# Patient Record
Sex: Female | Born: 1975 | Race: White | Hispanic: No | Marital: Married | Smoking: Former smoker
Health system: Southern US, Community
[De-identification: ages and names within clinical notes are randomized; demographics above are authoritative.]

## PROBLEM LIST (undated history)

## (undated) DIAGNOSIS — N390 Urinary tract infection, site not specified: Secondary | ICD-10-CM

## (undated) DIAGNOSIS — D649 Anemia, unspecified: Secondary | ICD-10-CM

## (undated) HISTORY — DX: Anemia, unspecified: D64.9

## (undated) HISTORY — DX: Urinary tract infection, site not specified: N39.0

---

## 2010-06-17 ENCOUNTER — Inpatient Hospital Stay (HOSPITAL_COMMUNITY): Admission: AD | Admit: 2010-06-17 | Discharge: 2010-06-20 | Payer: Self-pay | Admitting: Obstetrics and Gynecology

## 2010-08-22 LAB — HM PAP SMEAR: HM Pap smear: NORMAL

## 2011-02-14 LAB — CBC
HCT: 33.8 % — ABNORMAL LOW (ref 36.0–46.0)
HCT: 38.3 % (ref 36.0–46.0)
Hemoglobin: 13 g/dL (ref 12.0–15.0)
MCV: 92.9 fL (ref 78.0–100.0)
RBC: 3.65 MIL/uL — ABNORMAL LOW (ref 3.87–5.11)
RBC: 4.16 MIL/uL (ref 3.87–5.11)
WBC: 11.8 10*3/uL — ABNORMAL HIGH (ref 4.0–10.5)
WBC: 12.4 10*3/uL — ABNORMAL HIGH (ref 4.0–10.5)

## 2011-02-14 LAB — RPR: RPR Ser Ql: NONREACTIVE

## 2011-02-14 LAB — POCT PREGNANCY, URINE: Preg Test, Ur: NEGATIVE

## 2011-02-20 ENCOUNTER — Ambulatory Visit (INDEPENDENT_AMBULATORY_CARE_PROVIDER_SITE_OTHER): Payer: BC Managed Care – PPO | Admitting: Family Medicine

## 2011-02-20 ENCOUNTER — Encounter: Payer: Self-pay | Admitting: Family Medicine

## 2011-02-20 VITALS — BP 90/60 | HR 72 | Temp 99.0°F | Resp 12 | Ht 61.75 in | Wt 149.0 lb

## 2011-02-20 DIAGNOSIS — J159 Unspecified bacterial pneumonia: Secondary | ICD-10-CM

## 2011-02-20 NOTE — Progress Notes (Signed)
  Subjective:    Patient ID: Rachel Walker, female    DOB: 1975/12/23, 35 y.o.   MRN: [id number]  HPI  New patient to establish care. Unremarkable past medical history. Takes no regular medications. No prior surgeries. No known drug allergies. Was doing well until last week earlier in the week when she developed URI type symptoms. Then around Wednesday of  Last week she developed fever. Eventually was seen last Friday and had evaluation with x-ray which revealed community acquired pneumonia-?which lung. Patient was given some type of intramuscular antibiotic followed by Levaquin 500 milligrams daily for 5 days. She still has cough but no fever over the past few days. No history of asthma but some wheezing off and on. Was prescribed nebulizer which is used mostly at night. Overall is improving. No nausea or vomiting. Cough mostly nonproductive. Smoked only briefly several years ago.   Review of Systems  Constitutional: Negative for fever, chills and appetite change.  HENT: Negative for congestion and sore throat.   Respiratory: Positive for cough. Negative for shortness of breath and wheezing.   Cardiovascular: Negative for chest pain and leg swelling.  Skin: Negative for rash.       Objective:   Physical Exam  patient alert and nontoxic in appearance Oropharynx is clear Neck supple no adenopathy Chest clear to auscultation. No rales or wheezes noted Heart regular in rate no murmur Extremities no edema       Assessment & Plan:   resolving community acquired pneumonia. Followup immediately for any fever or worsening symptoms.

## 2011-02-20 NOTE — Patient Instructions (Signed)
Follow up immediately for any fever or worsening symptoms. 

## 2011-02-22 ENCOUNTER — Encounter: Payer: Self-pay | Admitting: Family Medicine

## 2012-01-04 ENCOUNTER — Encounter: Payer: Self-pay | Admitting: Family Medicine

## 2012-01-04 ENCOUNTER — Ambulatory Visit (INDEPENDENT_AMBULATORY_CARE_PROVIDER_SITE_OTHER): Payer: BC Managed Care – PPO | Admitting: Family Medicine

## 2012-01-04 VITALS — BP 130/84 | Temp 98.4°F | Wt 150.0 lb

## 2012-01-04 DIAGNOSIS — J310 Chronic rhinitis: Secondary | ICD-10-CM

## 2012-01-04 DIAGNOSIS — J3489 Other specified disorders of nose and nasal sinuses: Secondary | ICD-10-CM

## 2012-01-04 DIAGNOSIS — J029 Acute pharyngitis, unspecified: Secondary | ICD-10-CM

## 2012-01-04 MED ORDER — FLUTICASONE PROPIONATE 50 MCG/ACT NA SUSP: 2.0000 | Freq: Every day | NASAL | Status: DC

## 2012-01-04 NOTE — Progress Notes (Signed)
  Subjective:    Patient ID: Rachel Walker, female    DOB: 04/30/1976, 36 y.o.   MRN: [id number]  HPI  Acute visit. Patient seen with sore throat. Started a few days ago over the weekend. Also nasal congestion. Minimal if any cough. Advil helps. Symptoms are moderate. History of strep throat 2 times in past and patient wanting to rule out. No ill contacts. Some postnasal drip.  History of frequent nasal congestion following pregnancy one half years ago. Using daily afrin twice daily. She is aware she has some rebound swelling at this time. No history of nasal polyps.   Review of Systems  Constitutional: Positive for fatigue. Negative for chills.  HENT: Positive for congestion, sore throat and sinus pressure. Negative for ear pain and trouble swallowing.   Respiratory: Negative for cough.   Cardiovascular: Negative for chest pain.  Neurological: Negative for headaches.       Objective:   Physical Exam  Constitutional: She appears well-developed and well-nourished.  HENT:  Right Ear: External ear normal.  Left Ear: External ear normal.       Minimal erythema with no exudate Swollen nasal mucosa. Clear drainage. No polyps  Neck: Neck supple.  Cardiovascular: Normal rate and regular rhythm.   Pulmonary/Chest: Effort normal and breath sounds normal. No respiratory distress. She has no wheezes. She has no rales.  Lymphadenopathy:    She has no cervical adenopathy.          Assessment & Plan:  #1 pharyngitis. Rapid strep negative. Suspect viral pharyngitis. 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 Taper odd Afrin. Nasal steroid with Nasonex 2 sprays per nostril once daily

## 2012-01-04 NOTE — Patient Instructions (Signed)
Viral Pharyngitis Viral pharyngitis is a viral infection that produces redness, pain, and swelling (inflammation) of the throat. It can spread from person to person (contagious). CAUSES Viral pharyngitis is caused by inhaling a large amount of certain germs called viruses. Many different viruses cause viral pharyngitis. SYMPTOMS Symptoms of viral pharyngitis include:  Sore throat.   Tiredness.   Stuffy nose.   Low-grade fever.   Congestion.   Cough.  TREATMENT Treatment includes rest, drinking plenty of fluids, and the use of over-the-counter medication (approved by your caregiver). HOME CARE INSTRUCTIONS   Drink enough fluids to keep your urine clear or pale yellow.   Eat soft, cold foods such as ice cream, frozen ice pops, or gelatin dessert.   Gargle with warm salt water (1 tsp salt per 1 qt of water).   Nasonex 2 sprays per nostril once daily  If over age 37, throat lozenges may be used safely.   Only take over-the-counter or prescription medicines for pain, discomfort, or fever as directed by your caregiver. Do not take aspirin.  To help prevent spreading viral pharyngitis to others, avoid:  Mouth-to-mouth contact with others.   Sharing utensils for eating and drinking.   Coughing around others.  SEEK MEDICAL CARE IF:   You are better in a few days, then become worse.   You have a fever or pain not helped by pain medicines.   There are any other changes that concern you.  Document Released: 08/26/2005 Document Revised: 07/29/2011 Document Reviewed: 01/22/2011 [facility] Patient Information 2012 [place], [place].

## 2012-01-08 ENCOUNTER — Encounter: Payer: Self-pay | Admitting: Family Medicine

## 2012-01-08 ENCOUNTER — Ambulatory Visit (INDEPENDENT_AMBULATORY_CARE_PROVIDER_SITE_OTHER): Payer: BC Managed Care – PPO | Admitting: Family Medicine

## 2012-01-08 VITALS — BP 130/80 | Temp 98.2°F | Wt 149.0 lb

## 2012-01-08 DIAGNOSIS — H109 Unspecified conjunctivitis: Secondary | ICD-10-CM

## 2012-01-08 DIAGNOSIS — H10029 Other mucopurulent conjunctivitis, unspecified eye: Secondary | ICD-10-CM

## 2012-01-08 MED ORDER — POLYMYXIN B-TRIMETHOPRIM 10000-0.1 UNIT/ML-% OP SOLN: 2.0000 [drp] | OPHTHALMIC | Status: AC

## 2012-01-08 NOTE — Progress Notes (Signed)
  Subjective:    Patient ID: Rachel Walker, female    DOB: 1976/05/24, 36 y.o.   MRN: [id number]  HPI  Acute visit. Left eye drainage and matting with onset yesterday. Daughter recently diagnosed with pinkeye.  No blurred vision. No eye pain. Minimal pruritus. Patient was seen recently with viral illness. Sore throat and nasal congestion are slowly improving. No fever or chills. No visual changes.   Review of Systems  Constitutional: Negative for fever and chills.  Eyes: Positive for discharge, redness and itching. Negative for photophobia, pain and visual disturbance.       Objective:   Physical Exam  Constitutional: She appears well-developed and well-nourished.  Eyes: Pupils are equal, round, and reactive to light.       Left eye reveals erythema conjunctiva compared to the right. Pupils equal round reactive to light. Cornea normal  Cardiovascular: Normal rate and regular rhythm.   Pulmonary/Chest: Effort normal and breath sounds normal. No respiratory distress. She has no wheezes. She has no rales.          Assessment & Plan:  Bacterial conjunctivitis left eye. Warm compresses several times daily. Polytrim ophthalmic drops 2 drops left eye every 4 hours while awake

## 2014-06-26 ENCOUNTER — Encounter: Payer: BC Managed Care – PPO | Admitting: Family Medicine

## 2014-07-18 ENCOUNTER — Encounter: Payer: BC Managed Care – PPO | Admitting: Family Medicine

## 2014-11-11 ENCOUNTER — Ambulatory Visit (INDEPENDENT_AMBULATORY_CARE_PROVIDER_SITE_OTHER): Payer: BC Managed Care – PPO | Admitting: Internal Medicine

## 2014-11-11 ENCOUNTER — Ambulatory Visit (INDEPENDENT_AMBULATORY_CARE_PROVIDER_SITE_OTHER): Payer: BC Managed Care – PPO

## 2014-11-11 VITALS — BP 128/86 | HR 76 | Temp 98.5°F | Ht 62.5 in | Wt 136.4 lb

## 2014-11-11 DIAGNOSIS — R509 Fever, unspecified: Secondary | ICD-10-CM

## 2014-11-11 DIAGNOSIS — R05 Cough: Secondary | ICD-10-CM

## 2014-11-11 DIAGNOSIS — R059 Cough, unspecified: Secondary | ICD-10-CM

## 2014-11-11 LAB — POCT CBC
Granulocyte percent: 69 %G (ref 37–80)
HEMATOCRIT: 41.4 % (ref 37.7–47.9)
HEMOGLOBIN: 13.6 g/dL (ref 12.2–16.2)
LYMPH, POC: 1.4 (ref 0.6–3.4)
MCH: 29.6 pg (ref 27–31.2)
MCHC: 32.7 g/dL (ref 31.8–35.4)
MCV: 90.5 fL (ref 80–97)
MID (cbc): 0.4 (ref 0–0.9)
MPV: 7.8 fL (ref 0–99.8)
POC GRANULOCYTE: 4 (ref 2–6.9)
POC LYMPH %: 23.8 % (ref 10–50)
POC MID %: 7.2 % (ref 0–12)
Platelet Count, POC: 270 10*3/uL (ref 142–424)
RBC: 4.57 M/uL (ref 4.04–5.48)
RDW, POC: 13.2 %
WBC: 5.8 10*3/uL (ref 4.6–10.2)

## 2014-11-11 MED ORDER — ALBUTEROL SULFATE 0.63 MG/3ML IN NEBU: 1.0000 | INHALATION_SOLUTION | Freq: Four times a day (QID) | RESPIRATORY_TRACT | Status: DC | PRN

## 2014-11-11 MED ORDER — AZITHROMYCIN 500 MG PO TABS: ORAL_TABLET | ORAL | Status: DC

## 2014-11-11 MED ORDER — HYDROCODONE-HOMATROPINE 5-1.5 MG/5ML PO SYRP: 5.0000 mL | ORAL_SOLUTION | Freq: Four times a day (QID) | ORAL | Status: DC | PRN

## 2014-11-11 NOTE — Progress Notes (Signed)
   Subjective:    Patient ID: Rachel Walker, female    DOB: 01/04/1976, 38 y.o.   MRN: [id number]  HPI Fever and nonproductive cough for 5 days/worse at night/fatigue No sore throat or runny nose Fever to 103 Telemetry medicine prescription for Zithromax started 2 days ago Felt better yesterday morning but again last night and this morning had fevers Fatigue  Teacher sixth-grade [employer]  Review of Systems No headache No nausea vomiting diarrhea No urinary symptoms    Objective:   Physical Exam BP 128/86 mmHg  Pulse 76  Temp(Src) 98.5 F (36.9 C) (Oral)  Ht 5' 2.5" (1.588 m)  Wt 136 lb 6.4 oz (61.871 kg)  BMI 24.54 kg/m2  SpO2 100%  LMP 11/05/2014 Conjunctiva slightly injected TMs clear Nares clear Throat clear/no cervical adenopathy Chest with rales left lower lung field posteriorly Wheezing with forced expiration Skin clear Alert and oriented      [facility] reading (PRIMARY) by  Dr. Merla Richesoolittle= patchy left-sided infiltrate. Results for orders placed or performed in visit on 11/11/14  POCT CBC  Result Value Ref Range   WBC 5.8 4.6 - 10.2 K/uL   Lymph, poc 1.4 0.6 - 3.4   POC LYMPH PERCENT 23.8 10 - 50 %L   MID (cbc) 0.4 0 - 0.9   POC MID % 7.2 0 - 12 %M   POC Granulocyte 4.0 2 - 6.9   Granulocyte percent 69.0 37 - 80 %G   RBC 4.57 4.04 - 5.48 M/uL   Hemoglobin 13.6 12.2 - 16.2 g/dL   HCT, POC 40.941.4 81.137.7 - 47.9 %   MCV 90.5 80 - 97 fL   MCH, POC 29.6 27 - 31.2 pg   MCHC 32.7 31.8 - 35.4 g/dL   RDW, POC 91.413.2 %   Platelet Count, POC 270 142 - 424 K/uL   MPV 7.8 0 - 99.8 fL     Assessment & Plan:  Cough - Plan: POCT CBC, DG Chest 2 View  Fever, unspecified fever cause - Plan: POCT CBC, DG Chest 2 View  Pneumonia secondary to viral illness  Meds ordered this encounter  Medications  . DISCONTD: azithromycin (ZITHROMAX) 250 MG tablet    Sig: Take by mouth daily.  Marland Kitchen. albuterol (ACCUNEB) 0.63 MG/3ML nebulizer solution    Sig: Take 3 mLs (0.63 mg total)  by nebulization every 6 (six) hours as needed.    Dispense:  60 mL    Refill:  0  . azithromycin (ZITHROMAX) 500 MG tablet    Sig: One a day to complete 5 d treatment    Dispense:  2 tablet    Refill:  0  . HYDROcodone-homatropine (HYCODAN) 5-1.5 MG/5ML syrup    Sig: Take 5 mLs by mouth every 6 (six) hours as needed.    Dispense:  120 mL    Refill:  0   Follow-up 48 hours if not improving

## 2014-11-12 ENCOUNTER — Telehealth: Payer: Self-pay | Admitting: *Deleted

## 2014-11-12 ENCOUNTER — Telehealth: Payer: Self-pay | Admitting: Family Medicine

## 2014-11-12 NOTE — Telephone Encounter (Signed)
Should be seen in follow up tomorrow if still feeling poorly-especially for any persistent fever or shortness of breath.

## 2014-11-12 NOTE — Telephone Encounter (Signed)
Pt's husband Pt was dx with pneumonia on 11/11/14.  She is taking zpack that telemetry medicine gave her and she is on the 3rd day and albuterol rx'd by urgent care on [address].  Last night she had a fever of 102.8.  Husband is requesting an appt with Dr Caryl NeverBurchette tomorrow but he is going to get his wife to call back.  I also informed pts husband that when she finishes the zpack it will still continue to work 7 days after finishing.  Please review and advise

## 2014-11-12 NOTE — Telephone Encounter (Signed)
PLEASE NOTE: All timestamps contained within this report are represented as [place] Standard Time. CONFIDENTIALTY NOTICE: This fax transmission is intended only for the addressee. It contains information that is legally privileged, confidential or otherwise protected from use or disclosure. If you are not the intended recipient, you are strictly prohibited from reviewing, disclosing, copying using or disseminating any of this information or taking any action in reliance on or regarding this information. If you have received this fax in error, please notify us immediately by telephone so that we can arrange for its return to us. Phone: [phone], Toll-Free: [phone], Fax: [phone] Page: 1 of 2 Call Id: [id number] [facility] - Client TELEPHONE ADVICE RECORD [facility] Patient Name: Rachel RevereJESSICA Walker Gender: Unknown DOB: 04/01/1976 Age: 38 Y 11 M 6 D Return Phone Number: [phone] (Primary), [phone] (Secondary) Address: [address] City/State/Zip: [place] [address] Client [facility] - Client Client Site [facility] - Day Physician Evelena PeatBurchette, Bruce Contact Type Call Call Type Triage / Clinical Caller Name Creed CopperBryan Aumiller Relationship To Patient Spouse Return Phone Number [phone] (Primary) Chief Complaint Vomiting Initial Comment caller states his wife had flu sx last week - on sunday she got a z-pac - was dx with pneumonia. ALso was told she had a spot on her lungs - was also given albuterol. also is taking codeine and this am has been vomiting. Nurse Assessment Nurse: Kemper Durielarke, RN, Lurena Joinerebecca Date/Time Lamount Cohen(Eastern Time): 11/12/2014 11:27:35 AM Confirm and document reason for call. If symptomatic, describe symptoms. ---caller states his wife had flu sx last week - on sunday she got a z-pac - was dx with pneumonia. ALso was told she had a spot on her lungs - was also given  albuterol. also is taking codeine and this am has been vomiting x1. Has the patient traveled out of the country within the last 30 days? ---Not Applicable Does the patient require triage? ---Declined Triage Please document clinical information provided and list any resource used. ---Advised caller to have pt seen in office if they are concerned about her meds and diagnosis from urgent care. Pt refuses. Guidelines Guideline Title Affirmed Question Affirmed Notes Nurse Date/Time (Eastern Time) Disp. Time Lamount Cohen(Eastern Time) Disposition Final User 11/12/2014 11:40:15 AM Clinical Call Yes Kemper Durielarke, RN, Lurena Joinerebecca After Care Instructions Given Call Event Type User Date / Time Description Comments User: Betsey Holidayebecca, Clarke, RN Date/Time Lamount Cohen(Eastern Time): 11/12/2014 11:35:12 AM Pt is not with caller. Warm transferred to appt line. PLEASE NOTE: All timestamps contained within this report are represented as [place] Standard Time. CONFIDENTIALTY NOTICE: This fax transmission is intended only for the addressee. It contains information that is legally privileged, confidential or otherwise protected from use or disclosure. If you are not the intended recipient, you are strictly prohibited from reviewing, disclosing, copying using or disseminating any of this information or taking any action in reliance on or regarding this information. If you have received this fax in error, please notify us immediately by telephone so that we can arrange for its return to us. Phone: [phone], Toll-Free: [phone], Fax: [phone] Page: 2 of 2 Call Id: [id number]

## 2014-11-12 NOTE — Telephone Encounter (Signed)
Pt called back and said she if feeling weak, no fever, and vomited after taking some hydrocodone cough syrup that she got from Urgent Care.  Do you want to see pt tomorrow for a f/u?  Please advise

## 2014-11-12 NOTE — Telephone Encounter (Signed)
Spoke with patient and an appointment made with Dr Clent RidgesFry 11/13/14.

## 2014-11-13 ENCOUNTER — Ambulatory Visit (INDEPENDENT_AMBULATORY_CARE_PROVIDER_SITE_OTHER): Payer: BC Managed Care – PPO | Admitting: Family Medicine

## 2014-11-13 ENCOUNTER — Encounter: Payer: Self-pay | Admitting: Family Medicine

## 2014-11-13 VITALS — BP 108/70 | HR 72 | Temp 98.8°F | Ht 62.5 in | Wt 134.0 lb

## 2014-11-13 DIAGNOSIS — J189 Pneumonia, unspecified organism: Secondary | ICD-10-CM

## 2014-11-13 NOTE — Progress Notes (Signed)
   Subjective:    Patient ID: Rachel RevereJessica Walker, female    DOB: 06/29/1976, 38 y.o.   MRN: [id number]  HPI Here for follow up of a LLL pneumonia. She started having fever and a cough 2 weeks ago, then she had a telemedicine visit and was given a Zpack. She took this for 3 days but did not improve so she went to Urgent Care, where the pneumonia was seen on Xray. She was told to stay on Azithromycin, but the dose was doubled to 500 mg daily. She now has one more to take tomorrow. She feels better with less coughing and less SOB. The fever is gone.    Review of Systems  Constitutional: Negative.   Respiratory: Positive for cough and chest tightness.        Objective:   Physical Exam  Constitutional: She appears well-developed and well-nourished.  Cardiovascular: Normal rate, regular rhythm, normal heart sounds and intact distal pulses.   Pulmonary/Chest: Effort normal. No respiratory distress. She has no wheezes.  Soft rales at the left lower base          Assessment & Plan:  Partially resolved CAP. Finish out the Azithromycin. Recheck prn. Written out of work from 11-07-14 until 12-03-14.

## 2014-11-13 NOTE — Progress Notes (Signed)
Pre visit review using our clinic review tool, if applicable. No additional management support is needed unless otherwise documented below in the visit note. 

## 2014-11-20 ENCOUNTER — Other Ambulatory Visit: Payer: BC Managed Care – PPO

## 2014-11-27 ENCOUNTER — Encounter: Payer: BC Managed Care – PPO | Admitting: Family Medicine

## 2015-04-11 ENCOUNTER — Encounter: Payer: Self-pay | Admitting: Family Medicine

## 2015-04-11 ENCOUNTER — Ambulatory Visit (INDEPENDENT_AMBULATORY_CARE_PROVIDER_SITE_OTHER): Payer: BC Managed Care – PPO | Admitting: Family Medicine

## 2015-04-11 VITALS — BP 110/68 | HR 60 | Temp 98.6°F | Wt 137.0 lb

## 2015-04-11 DIAGNOSIS — L02429 Furuncle of limb, unspecified: Secondary | ICD-10-CM

## 2015-04-11 MED ORDER — DOXYCYCLINE HYCLATE 100 MG PO CAPS: 100.0000 mg | ORAL_CAPSULE | Freq: Two times a day (BID) | ORAL | Status: DC

## 2015-04-11 NOTE — Progress Notes (Signed)
   Subjective:    Patient ID: Rachel RevereJessica Walker, female    DOB: 01/26/1976, 39 y.o.   MRN: [id number]  HPI Patient seen with tender furuncle left axillary region. First noted about 3 days ago. Tender to palpation. No fevers or chills. No drainage. No change in size over the past few days. Indurated and nonfluctuant.  Past Medical History  Diagnosis Date  . UTI (urinary tract infection)   . Anemia    No past surgical history on file.  reports that she quit smoking about 15 years ago. Her smoking use included Cigarettes. She has a 2.5 pack-year smoking history. She has never used smokeless tobacco. She reports that she does not drink alcohol or use illicit drugs. family history includes Dementia in her maternal grandmother. No Known Allergies    Review of Systems  Constitutional: Negative for fever and chills.       Objective:   Physical Exam  Constitutional: She appears well-developed and well-nourished.  Cardiovascular: Normal rate and regular rhythm.   Skin:  Small erythematous furuncle left axillary region. No pustular center. Firm to palpation. Slightly tender. No fluctuance          Assessment & Plan:  Small furuncle left axillary region. Recommend warm compresses several times daily. No indication for I&D at this time. Doxycycline 100 mg twice daily. Follow-up for increase in size, fluctuance, or if not resolving soon

## 2015-04-11 NOTE — Progress Notes (Signed)
Pre visit review using our clinic review tool, if applicable. No additional management support is needed unless otherwise documented below in the visit note. 

## 2015-04-11 NOTE — Patient Instructions (Signed)
Use warm compresses several times daily

## 2015-05-27 ENCOUNTER — Encounter: Payer: BC Managed Care – PPO | Admitting: Family Medicine

## 2015-06-10 ENCOUNTER — Encounter: Payer: Self-pay | Admitting: Family Medicine

## 2015-06-10 ENCOUNTER — Ambulatory Visit (INDEPENDENT_AMBULATORY_CARE_PROVIDER_SITE_OTHER): Payer: BC Managed Care – PPO | Admitting: Family Medicine

## 2015-06-10 ENCOUNTER — Other Ambulatory Visit (HOSPITAL_COMMUNITY)
Admission: RE | Admit: 2015-06-10 | Discharge: 2015-06-10 | Disposition: A | Discharge status: Home / Self Care | Payer: BC Managed Care – PPO | Source: Ambulatory Visit | Attending: Family Medicine | Admitting: Family Medicine

## 2015-06-10 VITALS — BP 110/70 | HR 70 | Temp 97.7°F | Ht 62.5 in | Wt 143.0 lb

## 2015-06-10 DIAGNOSIS — Z0000 Encounter for general adult medical examination without abnormal findings: Secondary | ICD-10-CM

## 2015-06-10 DIAGNOSIS — Z01419 Encounter for gynecological examination (general) (routine) without abnormal findings: Secondary | ICD-10-CM | POA: Diagnosis not present

## 2015-06-10 NOTE — Progress Notes (Signed)
   Subjective:    Patient ID: Rachel Walker, female    DOB: 01/22/1976, 39 y.o.   MRN: [id number]  HPI Patient for complete physical. Generally very healthy. She has not had a Pap smear over 2 years and requesting this today. No history of baseline mammogram. She is fasting but has not had any recent labs. She takes no regular medications. Her biological father's family history is unknown. Maternal grandfather with dementia otherwise family history unrevealing. She exercises regularly. Nonsmoker. Recent fullness in the right ear. No dizziness. No hearing loss.  Past Medical History  Diagnosis Date  . UTI (urinary tract infection)   . Anemia    No past surgical history on file.  reports that she quit smoking about 15 years ago. Her smoking use included Cigarettes. She has a 2.5 pack-year smoking history. She has never used smokeless tobacco. She reports that she does not drink alcohol or use illicit drugs. family history includes Dementia in her maternal grandfather. No Known Allergies    Review of Systems  Constitutional: Negative for fever, activity change, appetite change, fatigue and unexpected weight change.  HENT: Negative for congestion, ear discharge, ear pain, hearing loss, sore throat and trouble swallowing.   Eyes: Negative for visual disturbance.  Respiratory: Negative for cough and shortness of breath.   Cardiovascular: Negative for chest pain and palpitations.  Gastrointestinal: Negative for abdominal pain, diarrhea, constipation and blood in stool.  Genitourinary: Negative for dysuria and hematuria.  Musculoskeletal: Negative for myalgias, back pain and arthralgias.  Skin: Negative for rash.  Neurological: Negative for dizziness, syncope and headaches.  Hematological: Negative for adenopathy.  Psychiatric/Behavioral: Negative for confusion and dysphoric mood.       Objective:   Physical Exam  Constitutional: She is oriented to person, place, and time. She appears  well-developed and well-nourished.  HENT:  Head: Normocephalic and atraumatic.  Eyes: EOM are normal. Pupils are equal, round, and reactive to light.  Neck: Normal range of motion. Neck supple. No thyromegaly present.  Cardiovascular: Normal rate, regular rhythm and normal heart sounds.   No murmur heard. Pulmonary/Chest: Breath sounds normal. No respiratory distress. She has no wheezes. She has no rales.  Abdominal: Soft. Bowel sounds are normal. She exhibits no distension and no mass. There is no tenderness. There is no rebound and no guarding.  Genitourinary: Vagina normal and uterus normal.  Cervix appears normal. Pap smear obtained. Bimanual exam no masses. Breasts are symmetric with no masses. No nipple inversion or skin dimpling  Musculoskeletal: Normal range of motion. She exhibits no edema.  Lymphadenopathy:    She has no cervical adenopathy.  Neurological: She is alert and oriented to person, place, and time. She displays normal reflexes. No cranial nerve deficit.  Skin: No rash noted.  Psychiatric: She has a normal mood and affect. Her behavior is normal. Judgment and thought content normal.          Assessment & Plan:  Complete physical. Obtain screening lab work. Pap smear obtained. Schedule baseline mammogram. Confirm date of last tetanus. She has small serous effusion and we explained these are usually self-limited. Follow-up as needed

## 2015-06-10 NOTE — Patient Instructions (Signed)
Schedule baseline mammogram Confirm date of last tetanus vaccine-Tdap.  Serous Otitis Media Serous otitis media is fluid in the middle ear space. This space contains the bones for hearing and air. Air in the middle ear space helps to transmit sound.  The air gets there through the eustachian tube. This tube goes from the back of the nose (nasopharynx) to the middle ear space. It keeps the pressure in the middle ear the same as the outside world. It also helps to drain fluid from the middle ear space. CAUSES  Serous otitis media occurs when the eustachian tube gets blocked. Blockage can come from:  Ear infections.  Colds and other upper respiratory infections.  Allergies.  Irritants such as cigarette smoke.  Sudden changes in air pressure (such as descending in an airplane).  Enlarged adenoids.  A mass in the nasopharynx. During colds and upper respiratory infections, the middle ear space can become temporarily filled with fluid. This can happen after an ear infection also. Once the infection clears, the fluid will generally drain out of the ear through the eustachian tube. If it does not, then serous otitis media occurs. SIGNS AND SYMPTOMS   Hearing loss.  A feeling of fullness in the ear, without pain.  Young children may not show any symptoms but may show slight behavioral changes, such as agitation, ear pulling, or crying. DIAGNOSIS  Serous otitis media is diagnosed by an ear exam. Tests may be done to check on the movement of the eardrum. Hearing exams may also be done. TREATMENT  The fluid most often goes away without treatment. If allergy is the cause, allergy treatment may be helpful. Fluid that persists for several months may require minor surgery. A small tube is placed in the eardrum to:  Drain the fluid.  Restore the air in the middle ear space. In certain situations, antibiotic medicines are used to avoid surgery. Surgery may be done to remove enlarged adenoids (if  this is the cause). HOME CARE INSTRUCTIONS   Keep children away from tobacco smoke.  Keep all follow-up visits as directed by your health care provider. SEEK MEDICAL CARE IF:   Your hearing is not better in 3 months.  Your hearing is worse.  You have ear pain.  You have drainage from the ear.  You have dizziness.  You have serous otitis media only in one ear or have any bleeding from your nose (epistaxis).  You notice a lump on your neck. MAKE SURE YOU:  Understand these instructions.   Will watch your condition.   Will get help right away if you are not doing well or get worse.  Document Released: 02/06/2004 Document Revised: 04/02/2014 Document Reviewed: 06/13/2013 [facility] Patient Information 2015 [place]. This information is not intended to replace advice given to you by your health care provider. Make sure you discuss any questions you have with your health care provider.

## 2015-06-10 NOTE — Progress Notes (Signed)
Pre visit review using our clinic review tool, if applicable. No additional management support is needed unless otherwise documented below in the visit note. 

## 2015-06-11 ENCOUNTER — Other Ambulatory Visit: Payer: BC Managed Care – PPO

## 2015-06-11 LAB — CBC WITH DIFFERENTIAL/PLATELET
BASOS ABS: 0 10*3/uL (ref 0.0–0.1)
Basophils Relative: 0.5 % (ref 0.0–3.0)
EOS PCT: 0.7 % (ref 0.0–5.0)
Eosinophils Absolute: 0.1 10*3/uL (ref 0.0–0.7)
HCT: 36.6 % (ref 36.0–46.0)
HEMOGLOBIN: 12.3 g/dL (ref 12.0–15.0)
LYMPHS PCT: 32.1 % (ref 12.0–46.0)
Lymphs Abs: 2.6 10*3/uL (ref 0.7–4.0)
MCHC: 33.5 g/dL (ref 30.0–36.0)
MCV: 91.2 fl (ref 78.0–100.0)
MONO ABS: 0.7 10*3/uL (ref 0.1–1.0)
MONOS PCT: 8 % (ref 3.0–12.0)
Neutro Abs: 4.8 10*3/uL (ref 1.4–7.7)
Neutrophils Relative %: 58.7 % (ref 43.0–77.0)
PLATELETS: 252 10*3/uL (ref 150.0–400.0)
RBC: 4.01 Mil/uL (ref 3.87–5.11)
RDW: 14 % (ref 11.5–15.5)
WBC: 8.2 10*3/uL (ref 4.0–10.5)

## 2015-06-11 LAB — BASIC METABOLIC PANEL
BUN: 11 mg/dL (ref 6–23)
CHLORIDE: 105 meq/L (ref 96–112)
CO2: 25 meq/L (ref 19–32)
Calcium: 8.8 mg/dL (ref 8.4–10.5)
Creatinine, Ser: 0.7 mg/dL (ref 0.40–1.20)
GFR: 98.25 mL/min (ref >60.00)
GLUCOSE: 79 mg/dL (ref 70–99)
POTASSIUM: 4.4 meq/L (ref 3.5–5.1)
Sodium: 138 mEq/L (ref 135–145)

## 2015-06-11 LAB — LIPID PANEL
Cholesterol: 156 mg/dL (ref 0–200)
HDL: 63 mg/dL (ref >39.00)
LDL Cholesterol: 83 mg/dL (ref 0–99)
NONHDL: 93
Total CHOL/HDL Ratio: 2
Triglycerides: 50 mg/dL (ref 0.0–149.0)
VLDL: 10 mg/dL (ref 0.0–40.0)

## 2015-06-11 LAB — CYTOLOGY - PAP

## 2015-06-11 LAB — HEPATIC FUNCTION PANEL
ALK PHOS: 42 U/L (ref 39–117)
ALT: 11 U/L (ref 0–35)
AST: 18 U/L (ref 0–37)
Albumin: 4.1 g/dL (ref 3.5–5.2)
BILIRUBIN DIRECT: 0.1 mg/dL (ref 0.0–0.3)
BILIRUBIN TOTAL: 0.6 mg/dL (ref 0.2–1.2)
TOTAL PROTEIN: 7.3 g/dL (ref 6.0–8.3)

## 2015-06-11 LAB — TSH: TSH: 1.76 u[IU]/mL (ref 0.35–4.50)

## 2015-06-18 ENCOUNTER — Encounter: Payer: Self-pay | Admitting: Internal Medicine

## 2015-06-18 ENCOUNTER — Ambulatory Visit (INDEPENDENT_AMBULATORY_CARE_PROVIDER_SITE_OTHER): Payer: BC Managed Care – PPO | Admitting: Internal Medicine

## 2015-06-18 VITALS — BP 120/70 | HR 60 | Temp 98.5°F | Resp 18 | Ht 62.5 in | Wt 142.0 lb

## 2015-06-18 DIAGNOSIS — L02429 Furuncle of limb, unspecified: Secondary | ICD-10-CM | POA: Diagnosis not present

## 2015-06-18 MED ORDER — DOXYCYCLINE HYCLATE 100 MG PO TABS: 100.0000 mg | ORAL_TABLET | Freq: Two times a day (BID) | ORAL | Status: DC

## 2015-06-18 NOTE — Patient Instructions (Signed)
Take your antibiotic as prescribed until ALL of it is gone, but stop if you develop a rash, swelling, or any side effects of the medication.  Contact our office as soon as possible if  there are side effects of the medication.  Abscess An abscess is an infected area that contains a collection of pus and debris.It can occur in almost any part of the body. An abscess is also known as a furuncle or boil. CAUSES  An abscess occurs when tissue gets infected. This can occur from blockage of oil or sweat glands, infection of hair follicles, or a minor injury to the skin. As the body tries to fight the infection, pus collects in the area and creates pressure under the skin. This pressure causes pain. People with weakened immune systems have difficulty fighting infections and get certain abscesses more often.  SYMPTOMS Usually an abscess develops on the skin and becomes a painful mass that is red, warm, and tender. If the abscess forms under the skin, you may feel a moveable soft area under the skin. Some abscesses break open (rupture) on their own, but most will continue to get worse without care. The infection can spread deeper into the body and eventually into the bloodstream, causing you to feel ill.  DIAGNOSIS  Your caregiver will take your medical history and perform a physical exam. A sample of fluid may also be taken from the abscess to determine what is causing your infection. TREATMENT  Your caregiver may prescribe antibiotic medicines to fight the infection. However, taking antibiotics alone usually does not cure an abscess. Your caregiver may need to make a small cut (incision) in the abscess to drain the pus. In some cases, gauze is packed into the abscess to reduce pain and to continue draining the area. HOME CARE INSTRUCTIONS   Only take over-the-counter or prescription medicines for pain, discomfort, or fever as directed by your caregiver.  If you were prescribed antibiotics, take them as  directed. Finish them even if you start to feel better.  If gauze is used, follow your caregiver's directions for changing the gauze.  To avoid spreading the infection:  Keep your draining abscess covered with a bandage.  Wash your hands well.  Do not share personal care items, towels, or whirlpools with others.  Avoid skin contact with others.  Keep your skin and clothes clean around the abscess.  Keep all follow-up appointments as directed by your caregiver. SEEK MEDICAL CARE IF:   You have increased pain, swelling, redness, fluid drainage, or bleeding.  You have muscle aches, chills, or a general ill feeling.  You have a fever. MAKE SURE YOU:   Understand these instructions.  Will watch your condition.  Will get help right away if you are not doing well or get worse. Document Released: 08/26/2005 Document Revised: 05/17/2012 Document Reviewed: 01/29/2012 [facility] Patient Information 2015 [employer] [place]. This information is not intended to replace advice given to you by your health care provider. Make sure you discuss any questions you have with your health care provider.

## 2015-06-18 NOTE — Progress Notes (Signed)
   Subjective:    Patient ID: Rachel Walker, female    DOB: 02/24/1976, 39 y.o.   MRN: [id number]  HPI  39 year old patient who presents with a 3-4 day history of a painful nodule in the right axilla.  She was treated for a left axillary furuncle in May of this year.  She responded well to doxycycline.  Past Medical History  Diagnosis Date  . UTI (urinary tract infection)   . Anemia     History   Social History  . Marital Status: Married    Spouse Name: N/A  . Number of Children: N/A  . Years of Education: N/A   Occupational History  . Not on file.   Social History Main Topics  . Smoking status: Former Smoker -- 0.50 packs/day for 5 years    Types: Cigarettes    Quit date: 02/20/2000  . Smokeless tobacco: Never Used  . Alcohol Use: No  . Drug Use: No  . Sexual Activity: Not on file   Other Topics Concern  . Not on file   Social History Narrative    No past surgical history on file.  Family History  Problem Relation Age of Onset  . Dementia Maternal Grandfather     No Known Allergies  No current outpatient prescriptions on file prior to visit.   No current facility-administered medications on file prior to visit.    BP 120/70 mmHg  Pulse 60  Temp(Src) 98.5 F (36.9 C) (Oral)  Resp 18  Ht 5' 2.5" (1.588 m)  Wt 142 lb (64.411 kg)  BMI 25.54 kg/m2  SpO2 97%  LMP 06/10/2015      Review of Systems  Skin: Positive for rash.       Objective:   Physical Exam  Skin:  A 2 cm firm, erythematous, nonfluctuant nodule noted in the mid right axillary area.  Slightly tender to touch          Assessment & Plan:   Right axillary furuncle.  Patient responded well to doxycycline in the past.  Will retreat.  Local wound care discussed

## 2015-06-18 NOTE — Progress Notes (Signed)
Pre visit review using our clinic review tool, if applicable. No additional management support is needed unless otherwise documented below in the visit note. 

## 2015-10-16 ENCOUNTER — Other Ambulatory Visit: Payer: Self-pay

## 2015-10-16 DIAGNOSIS — Z1231 Encounter for screening mammogram for malignant neoplasm of breast: Secondary | ICD-10-CM

## 2015-11-01 ENCOUNTER — Ambulatory Visit
Admission: RE | Admit: 2015-11-01 | Discharge: 2015-11-01 | Disposition: A | Discharge status: Home / Self Care | Payer: 59 | Source: Ambulatory Visit

## 2015-11-01 DIAGNOSIS — Z1231 Encounter for screening mammogram for malignant neoplasm of breast: Secondary | ICD-10-CM

## 2017-11-18 ENCOUNTER — Ambulatory Visit (INDEPENDENT_AMBULATORY_CARE_PROVIDER_SITE_OTHER): Payer: Managed Care, Other (non HMO) | Admitting: *Deleted

## 2017-11-18 DIAGNOSIS — Z23 Encounter for immunization: Secondary | ICD-10-CM

## 2018-05-23 ENCOUNTER — Other Ambulatory Visit: Payer: Self-pay | Admitting: Family Medicine

## 2018-05-23 DIAGNOSIS — Z1231 Encounter for screening mammogram for malignant neoplasm of breast: Secondary | ICD-10-CM

## 2018-06-08 ENCOUNTER — Ambulatory Visit
Admission: RE | Admit: 2018-06-08 | Discharge: 2018-06-08 | Disposition: A | Discharge status: Home / Self Care | Payer: PRIVATE HEALTH INSURANCE | Source: Ambulatory Visit | Attending: Family Medicine | Admitting: Family Medicine

## 2018-06-08 DIAGNOSIS — Z6827 Body mass index (BMI) 27.0-27.9, adult: Secondary | ICD-10-CM | POA: Diagnosis not present

## 2018-06-08 DIAGNOSIS — Z1231 Encounter for screening mammogram for malignant neoplasm of breast: Secondary | ICD-10-CM | POA: Diagnosis not present

## 2018-06-08 DIAGNOSIS — Z01419 Encounter for gynecological examination (general) (routine) without abnormal findings: Secondary | ICD-10-CM | POA: Diagnosis not present

## 2019-02-01 ENCOUNTER — Other Ambulatory Visit: Payer: Self-pay

## 2019-02-01 ENCOUNTER — Ambulatory Visit (INDEPENDENT_AMBULATORY_CARE_PROVIDER_SITE_OTHER): Payer: BLUE CROSS/BLUE SHIELD | Admitting: Family Medicine

## 2019-02-01 ENCOUNTER — Encounter: Payer: Self-pay | Admitting: Family Medicine

## 2019-02-01 VITALS — BP 116/74 | HR 60 | Temp 98.2°F | Ht 61.5 in | Wt 145.1 lb

## 2019-02-01 DIAGNOSIS — R41840 Attention and concentration deficit: Secondary | ICD-10-CM

## 2019-02-01 NOTE — Progress Notes (Signed)
  Subjective:     Patient ID: Rachel Walker, female   DOB: 25-Sep-1976, 43 y.o.   MRN: [id number]  HPI Patient seen with concern for possible ADD.  She has a new job which is from home and she spends much of her day on computer and also in several meetings.  She has difficulty focusing and easy distractibility.  Difficulty completing tasks.  She states she has struggled with this for most of her life including early grade school and throughout her school years.    She has tried to compensate by setting up several reminders/triggers to remind herself of activities..  She is getting adequate sleep.  She has tried several over-the-counter supplements which have not seemed to help.  Past Medical History:  Diagnosis Date  . Anemia   . UTI (urinary tract infection)    History reviewed. No pertinent surgical history.  reports that she quit smoking about 18 years ago. Her smoking use included cigarettes. She has a 2.50 pack-year smoking history. She has never used smokeless tobacco. She reports that she does not drink alcohol or use drugs. family history includes Dementia in her maternal grandfather. No Known Allergies    Review of Systems  Constitutional: Negative for fatigue.  Eyes: Negative for visual disturbance.  Respiratory: Negative for cough, chest tightness, shortness of breath and wheezing.   Cardiovascular: Negative for chest pain, palpitations and leg swelling.  Neurological: Negative for dizziness, seizures, syncope, weakness, light-headedness and headaches.       Objective:   Physical Exam Constitutional:      Appearance: She is well-developed.  Eyes:     Pupils: Pupils are equal, round, and reactive to light.  Neck:     Musculoskeletal: Neck supple.     Thyroid: No thyromegaly.     Vascular: No JVD.  Cardiovascular:     Rate and Rhythm: Normal rate and regular rhythm.     Heart sounds: No gallop.   Pulmonary:     Effort: Pulmonary effort is normal. No respiratory  distress.     Breath sounds: Normal breath sounds. No wheezing or rales.  Neurological:     Mental Status: She is alert.        Assessment:     Concern for attention deficit disorder.  Patient has never been tested    Plan:     -We have given her connection to set up appointment with 1 of our clinical psychologists for further ADD testing and go from there  Kristian Covey MD [facility]

## 2019-06-22 ENCOUNTER — Ambulatory Visit (INDEPENDENT_AMBULATORY_CARE_PROVIDER_SITE_OTHER): Payer: BC Managed Care – PPO | Admitting: Family Medicine

## 2019-06-22 ENCOUNTER — Other Ambulatory Visit: Payer: Self-pay

## 2019-06-22 ENCOUNTER — Encounter: Payer: Self-pay | Admitting: Family Medicine

## 2019-06-22 DIAGNOSIS — Z20822 Contact with and (suspected) exposure to covid-19: Secondary | ICD-10-CM

## 2019-06-22 DIAGNOSIS — Z20828 Contact with and (suspected) exposure to other viral communicable diseases: Secondary | ICD-10-CM | POA: Diagnosis not present

## 2019-06-22 NOTE — Patient Instructions (Signed)
Follow up:  As needed and if any symptoms, concerns or positive test   Self Isolation/Home Quarantine: -see the CDC site for information:   RunningShows.co.za.html   -STAY HOME except for to seek medical care -stay in your own room away from others in your house and use a separate bathroom if possible -Wash hands frequently, wear a mask if you leave your room and interact as little as possible with others -seek medical care immediately if worsening - call our office for a visit or call ahead if going elsewhere to an urgent care  -seek emergency care if very sick or severe symptoms - call 911 -isolate for at least 14 days from the onset of symptoms PLUS 3 days of no fever PLUS 3 days of improving symptoms    Novel Coronavirus Testing: I sent an order for coronavirus testing. No Appointment is needed.  Testing Sites:    [facility] Location:                            [address] [place] (old [facility]) Hours:                                 8a-3:45p, M-F  [facility] Location:                           [address] [place], [place] [address]                                                              [place] ([place]) Hours:                                 8a-3:45p, M-F  [facility] Location:                            [facility], dispensing (across from [place]) Hours:                                 8a-3:45p, M-F  Positive test. These tests are not 100% perfect, but if you tested positive for COVID-19, this confirms that you have contracted the SARS-CoV-2 virus. STAY HOME to complete full Quarantine per CDC guidelines.  Negative test. These tests are not 100% perfect but if you tested negative for COVID-19, this indicates that you may not have contracted the SARS-CoV-2 virus. Follow your doctor's  recommendations and the CDC guidelines.

## 2019-06-22 NOTE — Progress Notes (Signed)
Virtual Visit via Telephone Note  I connected with Rachel Walker on 06/22/19 at  4:40 PM EDT by telephone and verified that I am speaking with the correct person using two identifiers.   I discussed the limitations, risks, security and privacy concerns of performing an evaluation and management service by telephone and the availability of in person appointments. I also discussed with the patient that there may be a patient responsible charge related to this service. The patient expressed understanding and agreed to proceed.  Location patient: home Location provider: work or home office Participants present for the call: patient, provider Patient did not have a visit in the prior 7 days to address this/these issue(s).   History of Present Illness:   Acute visit for COVID19 exposure: -close exposure (in same room, playing boardgames) with positive case 7/18 and 20th. Learned today that the individual tested positive, that individual tested 1 week ago and just got results (individual was asymptomatic) -wants COVID testing, wife high risk  -no symptoms currently -denies fevers, malaise, cough, congestion, sore throat, SOB, rashes, loss of taste or smell, GI symptoms  Observations/Objective: Patient sounds cheerful and well on the phone. I do not appreciate any SOB. Speech and thought processing are grossly intact. Patient reported vitals:  Assessment and Plan:  -nasal antigen testing, orders placed -limitations and risks false neg discussed -instructions provided for [facility] testing [facility] -home isolation/quarantine advised and discussed at length -signs and symptoms of disease discussed at length -precautions discussed at length -advised follow up visit/call if symptoms develop, concerns, question and/or positive test  I did not refer this patient for an OV in the next 24 hours for this/these issue(s).  I discussed the assessment and treatment plan with the patient. The  patient was provided an opportunity to ask questions and all were answered. The patient agreed with the plan and demonstrated an understanding of the instructions.   The patient was advised to call back or seek an in-person evaluation if the symptoms worsen or if the condition fails to improve as anticipated.  I provided 11 minutes of non-face-to-face time during this encounter.   Rachel KoyanagiHannah R Kim, DO   Patient Instructions  Follow up:  As needed and if any symptoms, concerns or positive test   Self Isolation/Home Quarantine: -see the CDC site for information:   DiscoHelp.sihttps://www.cdc.gov/coronavirus/2019-ncov/if-you-are-sick/steps-when-sick.html   -STAY HOME except for to seek medical care -stay in your own room away from others in your house and use a separate bathroom if possible -Wash hands frequently, wear a mask if you leave your room and interact as little as possible with others -seek medical care immediately if worsening - call our office for a visit or call ahead if going elsewhere to an urgent care  -seek emergency care if very sick or severe symptoms - call 911 -isolate for at least 14 days from the onset of symptoms PLUS 3 days of no fever PLUS 3 days of improving symptoms    Novel Coronavirus Testing: I sent an order for coronavirus testing. No Appointment is needed.  Testing Sites:    [facility] Location:                            [address], [place]  [place] (old [facility]) Hours:                                 8a-3:45p, M-F  [facility] Location:                           [address], [place], [place] [address]                                                              [place] ([place]) Hours:                                 8a-3:45p, M-F  [facility] Location:                            [address]ician, dispensing (across from [place]) Hours:                                  8a-3:45p, M-F  Positive test. These tests are not 100% perfect, but if you tested positive for COVID-19, this confirms that you have contracted the SARS-CoV-2 virus. STAY HOME to complete full Quarantine per CDC guidelines.  Negative test. These tests are not 100% perfect but if you tested negative for COVID-19, this indicates that you may not have contracted the SARS-CoV-2 virus. Follow your doctor's recommendations and the CDC guidelines.

## 2019-06-23 ENCOUNTER — Other Ambulatory Visit: Payer: Self-pay

## 2019-06-23 DIAGNOSIS — Z20822 Contact with and (suspected) exposure to covid-19: Secondary | ICD-10-CM

## 2019-06-27 ENCOUNTER — Encounter: Payer: Self-pay | Admitting: Family Medicine

## 2019-06-27 LAB — NOVEL CORONAVIRUS, NAA: SARS-CoV-2, NAA: NOT DETECTED

## 2019-07-07 DIAGNOSIS — Z03818 Encounter for observation for suspected exposure to other biological agents ruled out: Secondary | ICD-10-CM | POA: Diagnosis not present

## 2019-08-30 DIAGNOSIS — Z23 Encounter for immunization: Secondary | ICD-10-CM | POA: Diagnosis not present

## 2020-02-08 ENCOUNTER — Other Ambulatory Visit: Payer: Self-pay

## 2020-02-08 ENCOUNTER — Emergency Department (HOSPITAL_COMMUNITY)
Admission: EM | Admit: 2020-02-08 | Discharge: 2020-02-08 | Disposition: A | Discharge status: Home / Self Care | Payer: BC Managed Care – PPO | Attending: Emergency Medicine | Admitting: Emergency Medicine

## 2020-02-08 ENCOUNTER — Telehealth: Payer: Self-pay | Admitting: *Deleted

## 2020-02-08 ENCOUNTER — Emergency Department (HOSPITAL_COMMUNITY): Payer: BC Managed Care – PPO

## 2020-02-08 DIAGNOSIS — R0789 Other chest pain: Secondary | ICD-10-CM

## 2020-02-08 DIAGNOSIS — Z87891 Personal history of nicotine dependence: Secondary | ICD-10-CM | POA: Diagnosis not present

## 2020-02-08 DIAGNOSIS — Z20822 Contact with and (suspected) exposure to covid-19: Secondary | ICD-10-CM | POA: Insufficient documentation

## 2020-02-08 DIAGNOSIS — R079 Chest pain, unspecified: Secondary | ICD-10-CM | POA: Diagnosis not present

## 2020-02-08 DIAGNOSIS — I10 Essential (primary) hypertension: Secondary | ICD-10-CM | POA: Diagnosis not present

## 2020-02-08 LAB — CBC WITH DIFFERENTIAL/PLATELET
Abs Immature Granulocytes: 0.02 10*3/uL (ref 0.00–0.07)
Basophils Absolute: 0 10*3/uL (ref 0.0–0.1)
Basophils Relative: 1 %
Eosinophils Absolute: 0 10*3/uL (ref 0.0–0.5)
Eosinophils Relative: 0 %
HCT: 39.1 % (ref 36.0–46.0)
Hemoglobin: 12.7 g/dL (ref 12.0–15.0)
Immature Granulocytes: 0 %
Lymphocytes Relative: 19 %
Lymphs Abs: 1.6 10*3/uL (ref 0.7–4.0)
MCH: 30.1 pg (ref 26.0–34.0)
MCHC: 32.5 g/dL (ref 30.0–36.0)
MCV: 92.7 fL (ref 80.0–100.0)
Monocytes Absolute: 0.7 10*3/uL (ref 0.1–1.0)
Monocytes Relative: 8 %
Neutro Abs: 6 10*3/uL (ref 1.7–7.7)
Neutrophils Relative %: 72 %
Platelets: 285 10*3/uL (ref 150–400)
RBC: 4.22 MIL/uL (ref 3.87–5.11)
RDW: 12.9 % (ref 11.5–15.5)
WBC: 8.3 10*3/uL (ref 4.0–10.5)
nRBC: 0 % (ref 0.0–0.2)

## 2020-02-08 LAB — COMPREHENSIVE METABOLIC PANEL
ALT: 30 U/L (ref 0–44)
AST: 31 U/L (ref 15–41)
Albumin: 3.9 g/dL (ref 3.5–5.0)
Alkaline Phosphatase: 40 U/L (ref 38–126)
Anion gap: 13 (ref 5–15)
BUN: 7 mg/dL (ref 6–20)
CO2: 21 mmol/L — ABNORMAL LOW (ref 22–32)
Calcium: 8.6 mg/dL — ABNORMAL LOW (ref 8.9–10.3)
Chloride: 105 mmol/L (ref 98–111)
Creatinine, Ser: 0.85 mg/dL (ref 0.44–1.00)
GFR calc Af Amer: >60 mL/min (ref >60)
GFR calc non Af Amer: >60 mL/min (ref >60)
Glucose, Bld: 86 mg/dL (ref 70–99)
Potassium: 3.5 mmol/L (ref 3.5–5.1)
Sodium: 139 mmol/L (ref 135–145)
Total Bilirubin: 0.7 mg/dL (ref 0.3–1.2)
Total Protein: 6.8 g/dL (ref 6.5–8.1)

## 2020-02-08 LAB — URINALYSIS, ROUTINE W REFLEX MICROSCOPIC
Bilirubin Urine: NEGATIVE
Glucose, UA: NEGATIVE mg/dL
Hgb urine dipstick: NEGATIVE
Ketones, ur: 20 mg/dL — AB
Leukocytes,Ua: NEGATIVE
Nitrite: NEGATIVE
Protein, ur: NEGATIVE mg/dL
Specific Gravity, Urine: 1.006 (ref 1.005–1.030)
pH: 5 (ref 5.0–8.0)

## 2020-02-08 LAB — I-STAT BETA HCG BLOOD, ED (MC, WL, AP ONLY): I-stat hCG, quantitative: <5 m[IU]/mL (ref <5)

## 2020-02-08 LAB — TROPONIN I (HIGH SENSITIVITY)
Troponin I (High Sensitivity): 2 ng/L (ref <18)
Troponin I (High Sensitivity): 2 ng/L (ref <18)

## 2020-02-08 LAB — POC SARS CORONAVIRUS 2 AG -  ED: SARS Coronavirus 2 Ag: NEGATIVE

## 2020-02-08 LAB — D-DIMER, QUANTITATIVE: D-Dimer, Quant: 0.34 ug/mL-FEU (ref 0.00–0.50)

## 2020-02-08 NOTE — ED Notes (Signed)
Per lab able to add on d-dimer

## 2020-02-08 NOTE — ED Provider Notes (Signed)
[facility] EMERGENCY DEPARTMENT Provider Note   CSN: [id number] Arrival date & time: 02/08/20  1427     History Chief Complaint  Patient presents with  . Chest Pain    Rachel Walker is a 44 y.o. female with a past medical history of anemia, who presents today for evaluation of chest pain. She reports that about 3 days ago she started having mild aching in the left anterior chest.  She states that this started after gardening and she thought it was musculoskeletal.  She took Tylenol and that did not relieve her symptoms. She reports that today she was at rest and had sudden onset of left-sided chest pain radiating into her back and down her left arm with her left arm feeling pins-and-needles.  She denies any recent trauma. She reports that started she developed significant shortness of breath.  When she takes a full deep breath she has significant pain.  She was given aspirin and nitro by EMS, she reports that the nitro improved her pain however it is still present. She denies any nausea, vomiting, or diarrhea.  No abdominal pain.  She denies any fevers or cough.  She does not use any hormones stating that her husband is status post vasectomy. She denies any new medications or changes in medications.    HPI     Past Medical History:  Diagnosis Date  . Anemia   . UTI (urinary tract infection)     There are no problems to display for this patient.   No past surgical history on file.   OB History   No obstetric history on file.     Family History  Problem Relation Age of Onset  . Dementia Maternal Grandfather     Social History   Tobacco Use  . Smoking status: Former Smoker    Packs/day: 0.50    Years: 5.00    Pack years: 2.50    Types: Cigarettes    Quit date: 02/20/2000    Years since quitting: 19.9  . Smokeless tobacco: Never Used  Substance Use Topics  . Alcohol use: No    Alcohol/week: 0.0 standard drinks  . Drug use: No    Home  Medications Prior to Admission medications   Medication Sig Start Date End Date Taking? Authorizing Provider  acetaminophen (TYLENOL) 325 MG tablet Take 650 mg by mouth every 6 (six) hours as needed for mild pain, fever or headache.   Yes [provider]  Ginger, Zingiber officinalis, (GINGER PO) Take 1 tablet by mouth daily.   Yes [provider]  Turmeric (QC TUMERIC COMPLEX PO) Take 1 tablet by mouth daily.   Yes [provider]    Allergies    Patient has no known allergies.  Review of Systems   Review of Systems  Constitutional: Negative for chills, fatigue and fever.  Respiratory: Positive for shortness of breath. Negative for cough.   Cardiovascular: Positive for chest pain. Negative for palpitations and leg swelling.  Gastrointestinal: Negative for abdominal pain, diarrhea, nausea and vomiting.  Genitourinary: Negative for dysuria and urgency.  Musculoskeletal: Positive for back pain. Negative for neck pain.  Neurological: Negative for weakness and headaches.       Resolving paresthesias in the left arm.  All other systems reviewed and are negative.   Physical Exam Updated Vital Signs BP 113/84 (BP Location: Right Arm)   Pulse 79   Temp 98.4 F (36.9 C) (Oral)   Resp 17   Ht 5\' 2"  (  1.575 m)   Wt 72.1 kg   LMP 01/29/2020 (Within Days)   SpO2 98%   BMI 29.08 kg/m   Physical Exam Vitals and nursing note reviewed.  Constitutional:      General: She is not in acute distress.    Appearance: She is well-developed. She is not diaphoretic.  HENT:     Head: Normocephalic and atraumatic.  Eyes:     General: No scleral icterus.       Right eye: No discharge.        Left eye: No discharge.     Conjunctiva/sclera: Conjunctivae normal.  Cardiovascular:     Rate and Rhythm: Normal rate and regular rhythm.     Pulses:          Radial pulses are 2+ on the right side and 2+ on the left side.       Dorsalis pedis pulses are 2+ on the right side and  2+ on the left side.       Posterior tibial pulses are 2+ on the right side and 2+ on the left side.     Heart sounds: Normal heart sounds. No murmur.  Pulmonary:     Effort: Pulmonary effort is normal. Tachypnea present. No respiratory distress.     Breath sounds: Normal breath sounds. No stridor. No decreased breath sounds or wheezing.  Chest:     Chest wall: Tenderness present. No mass or deformity.     Comments: Palpation on the left anterior chest directly inferior to the clavicle both recreates and exacerbates her reported chest pain. Abdominal:     General: There is no distension.     Palpations: Abdomen is soft.     Tenderness: There is no abdominal tenderness. There is no guarding.  Musculoskeletal:        General: No deformity.     Cervical back: Normal range of motion and neck supple.     Right lower leg: No tenderness. No edema.     Left lower leg: No tenderness. No edema.     Comments: Unable to recreate back pain with palpation.   Skin:    General: Skin is warm and dry.  Neurological:     General: No focal deficit present.     Mental Status: She is alert.     Motor: No abnormal muscle tone.  Psychiatric:        Mood and Affect: Mood normal.        Behavior: Behavior normal.     ED Results / Procedures / Treatments   Labs (all labs ordered are listed, but only abnormal results are displayed) Labs Reviewed  COMPREHENSIVE METABOLIC PANEL - Abnormal; Notable for the following components:      Result Value   CO2 21 (*)    Calcium 8.6 (*)    All other components within normal limits  URINALYSIS, ROUTINE W REFLEX MICROSCOPIC - Abnormal; Notable for the following components:   Color, Urine STRAW (*)    Ketones, ur 20 (*)    All other components within normal limits  SARS CORONAVIRUS 2 (TAT 6-24 HRS)  CBC WITH DIFFERENTIAL/PLATELET  D-DIMER, QUANTITATIVE (NOT AT [facility])  I-STAT BETA HCG BLOOD, ED ([facility], [facility], [facility] ONLY)  POC SARS CORONAVIRUS 2 AG -  ED  TROPONIN I (HIGH  SENSITIVITY)  TROPONIN I (HIGH SENSITIVITY)    EKG EKG Interpretation  Date/Time:  Thursday February 08 2020 14:28:30 EST Ventricular Rate:  66 PR Interval:    QRS Duration: 86  QT Interval:  429 QTC Calculation: 450 R Axis:   33 Text Interpretation: Sinus rhythm Anterior infarct, old No previous ECGs available Confirmed by Richardean Canal ([id number]) on 02/08/2020 2:52:47 PM   Radiology DG Chest Port 1 View  Result Date: 02/08/2020 CLINICAL DATA:  Chest pain EXAM: PORTABLE CHEST 1 VIEW COMPARISON:  November 11, 2014 FINDINGS: The lungs are clear. Heart size and pulmonary vascularity are normal. No adenopathy. No pneumothorax. No bone lesions. IMPRESSION: No abnormality noted. Electronically Signed   By: Bretta Bang III M.D.   On: 02/08/2020 15:19    Procedures Procedures (including critical care time)  Medications Ordered in ED Medications - No data to display  ED Course  I have reviewed the triage vital signs and the nursing notes.  Pertinent labs & imaging results that were available during my care of the patient were reviewed by me and considered in my medical decision making (see chart for details).    MDM Rules/Calculators/A&P                     Patient is a healthy 44 year old woman who presents today for evaluation of chest pain.  She has been having a continuous achy left-sided chest pain over the past 3 to 4 days, however today had a sudden worsening of pain with associated shortness of breath.  She was given nitro by EMS which she reports relieved her pain. Here on exam I am able to exacerbate her chest pain with palpation of the left anterior chest, and with movements of the left arm. She was tachypneic on arrival therefore unable to apply PERC criteria.  D-dimer obtained that was negative.  Chest x-ray without evidence of consolidation pneumothorax or other abnormalities.  Troponin x2 is negative, EKG without evidence of acute ischemia.  CBC and CMP are unremarkable  without significant anemia, leukocytosis, or electrolyte derangements.   Suspect musculoskeletal etiology of her symptoms today. Recommended conservative care including heat/ice, ibuprofen, Tylenol, gentle stretching and range of motion.  Return precautions were discussed with patient who states their understanding.  At the time of discharge patient denied any unaddressed complaints or concerns.  Patient is agreeable for discharge home.  Note: Portions of this report may have been transcribed using voice recognition software. Every effort was made to ensure accuracy; however, inadvertent computerized transcription errors may be present  Final Clinical Impression(s) / ED Diagnoses Final diagnoses:  Atypical chest pain    Rx / DC Orders ED Discharge Orders    None       Cristina Gong, PA-C 02/08/20 2030    Charlynne Pander, MD 02/09/20 [phone]

## 2020-02-08 NOTE — Discharge Instructions (Signed)

## 2020-02-08 NOTE — Telephone Encounter (Signed)
--  Caller states she had soreness on the left side of her chestthought she had pulled a muscle while gardening. As day goes on- pain is constant and now a sharp pain, hurts even when breathing. Denies difficulty breathing. Patient was triage and was told to CALL EMS 911 NOW: * Immediate medical attention is needed. You need to hang up and call 911 (or an ambulance). * Triager Discretion: I'll call you back in a few minutes to be sure you were able to reach them. IF CALLER ASKS ABOUT ASPIRIN: * Call EMS 911 first. * If no aspirin allergy, chew an aspirin (160 to 325 mg) while waiting for the paramedics to arrive. CARE ADVICE given per Chest Pain (Adult) guideline. Patient went to ER as advised.

## 2020-02-08 NOTE — ED Triage Notes (Signed)
Pt brought in by [facility] from home for sudden onset left sided CP that radiates down left arm. Pt denies hx of same. Pt given 324mg  aspiring and x1 nitro with slight relief of pain PTA. Pt endorses SOB, states pain worsens with deep inspiration. Pt denies cardiac hx, denies NVD. Endorses slight dizziness. Per EMS pt NSR on ECG. Pt A+Ox4, skin warm and dry.

## 2020-02-08 NOTE — ED Notes (Signed)
Patient verbalizes understanding of discharge instructions. Opportunity for questioning and answers were provided. Armband removed by staff, pt discharged from ED.  

## 2020-02-08 NOTE — ED Notes (Signed)
Informed Dr. Messick of POC coronavirus result 

## 2020-02-09 ENCOUNTER — Encounter: Payer: Self-pay | Admitting: Family Medicine

## 2020-02-09 ENCOUNTER — Other Ambulatory Visit: Payer: Self-pay

## 2020-02-09 ENCOUNTER — Ambulatory Visit (INDEPENDENT_AMBULATORY_CARE_PROVIDER_SITE_OTHER): Payer: BC Managed Care – PPO | Admitting: Family Medicine

## 2020-02-09 VITALS — BP 112/76 | HR 78 | Temp 97.9°F | Ht 62.0 in | Wt 160.9 lb

## 2020-02-09 DIAGNOSIS — R0789 Other chest pain: Secondary | ICD-10-CM

## 2020-02-09 DIAGNOSIS — M940 Chondrocostal junction syndrome [Tietze]: Secondary | ICD-10-CM

## 2020-02-09 LAB — SARS CORONAVIRUS 2 (TAT 6-24 HRS): SARS Coronavirus 2: NEGATIVE

## 2020-02-09 NOTE — Telephone Encounter (Signed)
Patient has an appointment today at 3:15pm. Sending as Lorain Childes

## 2020-02-09 NOTE — Patient Instructions (Signed)
Costochondritis  Costochondritis is swelling and irritation (inflammation) of the tissue (cartilage) that connects your ribs to your breastbone (sternum). This causes pain in the front of your chest. The pain usually starts gradually and involves more than one rib. What are the causes? The exact cause of this condition is not always known. It results from stress on the cartilage where your ribs attach to your sternum. The cause of this stress could be:  Chest injury (trauma).  Exercise or activity, such as lifting.  Severe coughing. What increases the risk? You may be at higher risk for this condition if you:  Are female.  Are 30?45 years old.  Recently started a new exercise or work activity.  Have low levels of vitamin D.  Have a condition that makes you cough frequently. What are the signs or symptoms? The main symptom of this condition is chest pain. The pain:  Usually starts gradually and can be sharp or dull.  Gets worse with deep breathing, coughing, or exercise.  Gets better with rest.  May be worse when you press on the sternum-rib connection (tenderness). How is this diagnosed? This condition is diagnosed based on your symptoms, medical history, and a physical exam. Your health care provider will check for tenderness when pressing on your sternum. This is the most important finding. You may also have tests to rule out other causes of chest pain. These may include:  A chest X-ray to check for lung problems.  An electrocardiogram (ECG) to see if you have a heart problem that could be causing the pain.  An imaging scan to rule out a chest or rib fracture. How is this treated? This condition usually goes away on its own over time. Your health care provider may prescribe an NSAID to reduce pain and inflammation. Your health care provider may also suggest that you:  Rest and avoid activities that make pain worse.  Apply heat or cold to the area to reduce pain and  inflammation.  Do exercises to stretch your chest muscles. If these treatments do not help, your health care provider may inject a numbing medicine at the sternum-rib connection to help relieve the pain. Follow these instructions at home:  Avoid activities that make pain worse. This includes any activities that use chest, abdominal, and side muscles.  If directed, put ice on the painful area: ? Put ice in a plastic bag. ? Place a towel between your skin and the bag. ? Leave the ice on for 20 minutes, 2-3 times a day.  If directed, apply heat to the affected area as often as told by your health care provider. Use the heat source that your health care provider recommends, such as a moist heat pack or a heating pad. ? Place a towel between your skin and the heat source. ? Leave the heat on for 20-30 minutes. ? Remove the heat if your skin turns bright red. This is especially important if you are unable to feel pain, heat, or cold. You may have a greater risk of getting burned.  Take over-the-counter and prescription medicines only as told by your health care provider.  Return to your normal activities as told by your health care provider. Ask your health care provider what activities are safe for you.  Keep all follow-up visits as told by your health care provider. This is important. Contact a health care provider if:  You have chills or a fever.  Your pain does not go away or it gets   worse.  You have a cough that does not go away (is persistent). Get help right away if:  You have shortness of breath. This information is not intended to replace advice given to you by your health care provider. Make sure you discuss any questions you have with your health care provider. Document Revised: 12/01/2017 Document Reviewed: 03/11/2016 Elsevier Patient Education  2020 [employer].  Consider Vit D 2,000 IU  Once daily

## 2020-02-09 NOTE — Progress Notes (Signed)
  Subjective:     Patient ID: Rachel Walker, female   DOB: Apr 11, 1976, 44 y.o.   MRN: [id number]  HPI   Rachel Walker seen for ER follow-up.  She is generally very healthy.  Takes no regular medications.  Few days ago noticed some soreness left chest wall and this became severe yesterday morning.  She had some increased shortness of breath and pain with breathing and left chest wall tenderness.  She has not had any recent exertional chest pains.  She was given aspirin and nitroglycerin by EMS and her pain was slightly improved on arrival to the ER.  She had fairly extensive work-up with chest x-ray, EKG, multiple labs.  D-dimer normal.  Troponin was negative.  CBC unremarkable.  Her only change in activity was some gardening a few days ago.  She has not done any recent weight lifting.  Her current pain is 3 out of 10.  She took some ibuprofen 400 mg this morning and again around 11 AM and it seems to be helping.  She also tried some icing.  Studies/results from ER reviewed with patient.  Past Medical History:  Diagnosis Date  . Anemia   . UTI (urinary tract infection)    History reviewed. No pertinent surgical history.  reports that she quit smoking about 19 years ago. Her smoking use included cigarettes. She has a 2.50 pack-year smoking history. She has never used smokeless tobacco. She reports that she does not drink alcohol or use drugs. family history includes Dementia in her maternal grandfather. No Known Allergies   Review of Systems  Constitutional: Negative for appetite change, chills, fatigue and fever.  Eyes: Negative for visual disturbance.  Respiratory: Negative for cough, chest tightness, shortness of breath and wheezing.   Cardiovascular: Positive for chest pain. Negative for palpitations and leg swelling.  Gastrointestinal: Negative for abdominal pain.  Neurological: Negative for dizziness, seizures, syncope, weakness, light-headedness and headaches.       Objective:   Physical Exam Vitals reviewed.  Constitutional:      Appearance: Normal appearance.  Cardiovascular:     Rate and Rhythm: Normal rate and regular rhythm.     Heart sounds: No murmur. No friction rub. No gallop.   Pulmonary:     Effort: Pulmonary effort is normal.     Breath sounds: Normal breath sounds.     Comments: She has tenderness left chest wall region.  No visible swelling or erythema or ecchymosis. Chest:     Chest wall: Tenderness present.  Musculoskeletal:     Cervical back: Neck supple.  Skin:    Findings: No rash.  Neurological:     Mental Status: She is alert.        Assessment:     Acute left-sided chest pain.  Work-up in ER yesterday unrevealing as above.  Suspect musculoskeletal/costochondritis    Plan:     -Continue with intermittent icing -Continue ibuprofen and may take up to 800 mg every 8 hours as needed with food -Consider vitamin D 2000 international units once daily -Follow-up immediately for any new symptoms, worsening symptoms or persistence of symptoms  Kristian Covey MD [facility] at [facility]

## 2020-03-15 ENCOUNTER — Other Ambulatory Visit: Payer: Self-pay

## 2020-03-15 ENCOUNTER — Encounter: Payer: Self-pay | Admitting: Family Medicine

## 2020-03-15 ENCOUNTER — Ambulatory Visit (INDEPENDENT_AMBULATORY_CARE_PROVIDER_SITE_OTHER): Payer: BC Managed Care – PPO | Admitting: Family Medicine

## 2020-03-15 VITALS — BP 118/68 | HR 62 | Temp 98.4°F | Wt 162.6 lb

## 2020-03-15 DIAGNOSIS — R300 Dysuria: Secondary | ICD-10-CM

## 2020-03-15 LAB — POCT URINALYSIS DIPSTICK
Bilirubin, UA: 2
Glucose, UA: POSITIVE — AB
Nitrite, UA: POSITIVE
Protein, UA: POSITIVE — AB
Spec Grav, UA: 1.015 (ref 1.010–1.025)
Urobilinogen, UA: 2 E.U./dL — AB
pH, UA: 5 (ref 5.0–8.0)

## 2020-03-15 MED ORDER — NITROFURANTOIN MONOHYD MACRO 100 MG PO CAPS: 100.0000 mg | ORAL_CAPSULE | Freq: Two times a day (BID) | ORAL | 0 refills | Status: DC

## 2020-03-15 NOTE — Progress Notes (Signed)
  Subjective:     Patient ID: Rachel Walker, female   DOB: [birth year], 44 y.o.   MRN: [id number]  HPI   Rachel Walker states she got her Covid vaccine on Monday.  Tuesday she woke up with some aches and low-grade fever and she thought this may be related.   However, she had some suprapubic discomfort and dysuria along with some chills.  She started some Azo over-the-counter.  Her symptoms improved slightly but she had recurrence later this week.  She states she has not had UTI in several years.  No nausea or vomiting.  She has had some blood in her urine and clotting.  No known drug allergies  Past Medical History:  Diagnosis Date  . Anemia   . UTI (urinary tract infection)    No past surgical history on file.  reports that she quit smoking about 20 years ago. Her smoking use included cigarettes. She has a 2.50 pack-year smoking history. She has never used smokeless tobacco. She reports that she does not drink alcohol or use drugs. family history includes Dementia in her maternal grandfather. No Known Allergies   Review of Systems  Constitutional: Positive for fatigue.  Gastrointestinal: Negative for abdominal pain, nausea and vomiting.  Genitourinary: Positive for dysuria and frequency. Negative for flank pain.       Objective:   Physical Exam Vitals reviewed.  Constitutional:      Appearance: Normal appearance.  Cardiovascular:     Rate and Rhythm: Normal rate and regular rhythm.  Pulmonary:     Effort: Pulmonary effort is normal.     Breath sounds: Normal breath sounds.  Neurological:     Mental Status: She is alert.        Assessment:     3-day history of dysuria.  Suspect uncomplicated cystitis    Plan:     -Urine culture sent -Start Macrobid 1 p.o. twice daily for 5 days -Plenty of fluids -Follow-up promptly for any increasing fever, flank pain, recurrent vomiting, or other concerns  Kristian Covey MD [facility] at [facility]

## 2020-03-15 NOTE — Patient Instructions (Signed)

## 2020-03-17 LAB — URINE CULTURE
MICRO NUMBER:: 10373136
SPECIMEN QUALITY:: ADEQUATE

## 2020-07-01 ENCOUNTER — Telehealth (INDEPENDENT_AMBULATORY_CARE_PROVIDER_SITE_OTHER): Payer: Self-pay | Admitting: Family Medicine

## 2020-07-01 DIAGNOSIS — J069 Acute upper respiratory infection, unspecified: Secondary | ICD-10-CM

## 2020-07-01 NOTE — Progress Notes (Signed)
Patient ID: Rachel Walker, female   DOB: [birth year], 44 y.o.   MRN: [id number]   This visit type was conducted due to national recommendations for restrictions regarding the COVID-19 pandemic in an effort to limit this patient's exposure and mitigate transmission in our community.   This visit type was conducted due to national recommendations for restrictions regarding the COVID-19 pandemic in an effort to limit this patient's exposure and mitigate transmission in our community.   Virtual Visit via Telephone Note  I connected with Rachel Walker on 07/01/20 at  2:45 PM EDT by telephone and verified that I am speaking with the correct person using two identifiers.   I discussed the limitations, risks, security and privacy concerns of performing an evaluation and management service by telephone and the availability of in person appointments. I also discussed with the patient that there may be a patient responsible charge related to this service. The patient expressed understanding and agreed to proceed.  Location patient: home Location provider: work or home office Participants present for the call: patient, provider Patient did not have a visit in the prior 7 days to address this/these issue(s).   History of Present Illness: Rachel Walker called with onset last Friday of fever up to 102.  She had some cough which is occasionally productive but mostly dry.  She had some nasal congestion.  No sore throat.  No dyspnea.  No nausea or vomiting.  She has had Covid vaccine.  She states her parents had been at [employer] and stop by to visit and they had similar symptoms although their symptoms have already cleared.  There are no other sick family members.  She is keeping down fluids well and able to rest at night.  She has used ibuprofen for body aches and fever.  Past Medical History:  Diagnosis Date  . Anemia   . UTI (urinary tract infection)    No past surgical history on file.  reports  that she quit smoking about 20 years ago. Her smoking use included cigarettes. She has a 2.50 pack-year smoking history. She has never used smokeless tobacco. She reports that she does not drink alcohol and does not use drugs. family history includes Dementia in her maternal grandfather. No Known Allergies    Observations/Objective: Patient sounds cheerful and well on the phone. I do not appreciate any SOB. Speech and thought processing are grossly intact. Patient reported vitals:  Assessment and Plan:  URI symptoms.  Patient has been Covid vaccinated but is aware that this certainly could still be Covid.  She does not appear to be in any distress and has no dyspnea.  We discussed the following  -Continue to stay quarantined at this time until further clarified -Consider going to get Covid tested to further clarify -Plenty of fluids and rest -Recommend ER follow-up to further assess if she develops any dyspnea or worsening symptoms  Follow Up Instructions:  -As above   [id number] 5-10 [id number] 11-20 [id number] 21-30 I did not refer this patient for an OV in the next 24 hours for this/these issue(s).  I discussed the assessment and treatment plan with the patient. The patient was provided an opportunity to ask questions and all were answered. The patient agreed with the plan and demonstrated an understanding of the instructions.   The patient was advised to call back or seek an in-person evaluation if the symptoms worsen or if the condition fails to improve as anticipated.  I provided 13 minutes of non-face-to-face time during  this encounter.   Carolann Littler, MD

## 2020-08-08 ENCOUNTER — Other Ambulatory Visit: Payer: Self-pay | Admitting: Obstetrics and Gynecology

## 2020-08-08 DIAGNOSIS — R928 Other abnormal and inconclusive findings on diagnostic imaging of breast: Secondary | ICD-10-CM

## 2020-08-15 ENCOUNTER — Other Ambulatory Visit: Payer: Self-pay

## 2020-08-15 ENCOUNTER — Ambulatory Visit: Payer: Self-pay

## 2020-08-15 ENCOUNTER — Ambulatory Visit
Admission: RE | Admit: 2020-08-15 | Discharge: 2020-08-15 | Disposition: A | Discharge status: Home / Self Care | Payer: No Typology Code available for payment source | Source: Ambulatory Visit | Attending: Obstetrics and Gynecology | Admitting: Obstetrics and Gynecology

## 2020-08-15 DIAGNOSIS — R928 Other abnormal and inconclusive findings on diagnostic imaging of breast: Secondary | ICD-10-CM

## 2020-12-27 ENCOUNTER — Ambulatory Visit: Payer: No Typology Code available for payment source | Admitting: Family Medicine

## 2020-12-30 ENCOUNTER — Ambulatory Visit: Payer: No Typology Code available for payment source | Admitting: Family Medicine

## 2020-12-30 ENCOUNTER — Other Ambulatory Visit: Payer: Self-pay

## 2020-12-30 ENCOUNTER — Encounter: Payer: Self-pay | Admitting: Family Medicine

## 2020-12-30 VITALS — BP 120/80 | HR 52 | Ht 62.0 in | Wt 176.0 lb

## 2020-12-30 DIAGNOSIS — Z1211 Encounter for screening for malignant neoplasm of colon: Secondary | ICD-10-CM | POA: Diagnosis not present

## 2020-12-30 NOTE — Patient Instructions (Signed)
https://www.cancer.org/cancer/colon-rectal-cancer/causes-risks-prevention/risk-factors.html">  Colorectal Cancer Screening  Colorectal cancer screening is a group of tests that are used to check for colorectal cancer before symptoms develop. Colorectal refers to the colon and rectum. The colon and rectum are located at the end of the digestive tract and carry stool (feces) out of the body. Who should have screening? All adults who are 45-46 years old should have screening. Your health care provider may recommend screening before age 45. You will have tests every 1-10 years, depending on your results and the type of screening test. Screening recommendations for adults who are 76-85 years old vary depending on a person's health. People older than age 85 should no longer get colorectal cancer screening. You may have screening tests starting before age 45, or more often than other people, if you have any of these risk factors:  A personal or family history of colorectal cancer or abnormal growths known as polyps in your colon.  Inflammatory bowel disease, such as ulcerative colitis or Crohn's disease.  A history of having radiation treatment to the abdomen or the area between the hip bones (pelvic area) for cancer.  A type of genetic syndrome that is passed from parent to child (hereditary), such as: ? Lynch syndrome. ? Familial adenomatous polyposis. ? Turcot syndrome. ? Peutz-Jeghers syndrome. ? MUTYH-associated polyposis (MAP).  A personal history of diabetes. Types of tests There are several types of colorectal screening tests. You may have one or more of the following:  Guaiac-based fecal occult blood testing. For this test, a stool sample is checked for hidden (occult) blood, which could be a sign of colorectal cancer.  Fecal immunochemical test (FIT). For this test, a stool sample is checked for blood, which could be a sign of colorectal cancer.  Stool DNA test. For this test, a stool  sample is checked for blood and changes in DNA that could lead to colorectal cancer.  Sigmoidoscopy. During this test, a thin, flexible tube with a camera on the end, called a sigmoidoscope, is used to examine the rectum and the lower colon.  Colonoscopy. During this test, a long, flexible tube with a camera on the end, called a colonoscope, is used to examine the entire colon and rectum. Also, sometimes a tissue sample is taken to be looked at under a microscope (biopsy) or small polyps are removed during this test.  Virtual colonoscopy. Instead of a colonoscope, this type of colonoscopy uses a CT scan to take pictures of the colon and rectum. A CT scan is a type of X-ray that is made using computers. What are the benefits of screening? Screening reduces your risk for colorectal cancer and can help identify cancer at an early stage, when the cancer can be removed or treated more easily. It is common for polyps to form in the lining of the colon, especially as you age. These polyps may be cancerous or become cancerous over time. Screening can identify these polyps. What are the risks of screening? Generally, these are safe tests. However, problems may occur, including:  The need for more tests to confirm results from a stool sample test. Stool sample tests have fewer risks than other types of screening tests.  Being exposed to low levels of radiation, if you had a test involving X-rays. This may slightly increase your cancer risk. The benefit of detecting cancer outweighs the slight increase in risk.  Bleeding, damage to the intestine, or infection caused by a sigmoidoscopy or colonoscopy.  A reaction to medicines given   during a sigmoidoscopy or colonoscopy. Talk with your health care provider to understand your risk for colorectal cancer and to make a screening plan that is right for you. Questions to ask your health care provider  When should I start colorectal cancer screening?  What is my  risk for colorectal cancer?  How often do I need screening?  Which screening tests do I need?  How do I get my test results?  What do my results mean? Where to find more information Learn more about colorectal cancer screening from:  The American Cancer Society: cancer.org  National Cancer Institute: cancer.gov Summary  Colorectal cancer screening is a group of tests used to check for colorectal cancer before symptoms develop.  All adults who are 45-46 years old should have screening. Your health care provider may recommend screening before age 45.  You may have screening tests starting before age 45, or more often than other people, if you have certain risk factors.  Screening reduces your risk for colorectal cancer and can help identify cancer at an early stage, when the cancer can be removed or treated more easily.  Talk with your health care provider to understand your risk for colorectal cancer and to make a screening plan that is right for you. This information is not intended to replace advice given to you by your health care provider. Make sure you discuss any questions you have with your health care provider. Document Revised: 03/06/2020 Document Reviewed: 03/06/2020 Elsevier Patient Education  2021 Elsevier Inc.  

## 2020-12-30 NOTE — Progress Notes (Signed)
Established Patient Office Visit  Subjective:  Patient ID: Rachel Walker, female    DOB: December 31, 1975  Age: 45 y.o. MRN: [id number]  CC:  Chief Complaint  Patient presents with  . Colon Cancer Screening    HPI Baylyn Sickles presents to discuss colon cancer screening.  No known family history of colon cancer.  Her biologic father's health is unknown.  Keyshawna denies any symptoms such as bloody stools, appetite change, weight loss.  She states she has tendencies toward some chronic constipation intermittently but no recent change in stool habits.  She had CBC last March with no anemia.  She had Covid infection back earlier this winter but is fully recovered.  She has been vaccinated with the first 2 vaccines but not booster.  Past Medical History:  Diagnosis Date  . Anemia   . UTI (urinary tract infection)     No past surgical history on file.  Family History  Problem Relation Age of Onset  . Dementia Maternal Grandfather     Social History   Socioeconomic History  . Marital status: Married    Spouse name: Not on file  . Number of children: Not on file  . Years of education: Not on file  . Highest education level: Not on file  Occupational History  . Not on file  Tobacco Use  . Smoking status: Former Smoker    Packs/day: 0.50    Years: 5.00    Pack years: 2.50    Types: Cigarettes    Quit date: 02/20/2000    Years since quitting: 20.8  . Smokeless tobacco: Never Used  Vaping Use  . Vaping Use: Never used  Substance and Sexual Activity  . Alcohol use: No    Alcohol/week: 0.0 standard drinks  . Drug use: No  . Sexual activity: Not on file  Other Topics Concern  . Not on file  Social History Narrative  . Not on file   Social Determinants of Health   Financial Resource Strain: Not on file  Food Insecurity: Not on file  Transportation Needs: Not on file  Physical Activity: Not on file  Stress: Not on file  Social Connections: Not on file   Intimate Partner Violence: Not on file    Outpatient Medications Prior to Visit  Medication Sig Dispense Refill  . acetaminophen (TYLENOL) 325 MG tablet Take 650 mg by mouth every 6 (six) hours as needed for mild pain, fever or headache.    . Ginger, Zingiber officinalis, (GINGER PO) Take 1 tablet by mouth daily.    . nitrofurantoin, macrocrystal-monohydrate, (MACROBID) 100 MG capsule Take 1 capsule (100 mg total) by mouth 2 (two) times daily. 10 capsule 0  . Turmeric (QC TUMERIC COMPLEX PO) Take 1 tablet by mouth daily.     No facility-administered medications prior to visit.    No Known Allergies  ROS Review of Systems  Constitutional: Negative for appetite change, chills, fever and unexpected weight change.  Gastrointestinal: Positive for constipation. Negative for abdominal pain, diarrhea, nausea and vomiting.      Objective:    Physical Exam Vitals reviewed.  Constitutional:      Appearance: Normal appearance.  Cardiovascular:     Rate and Rhythm: Normal rate and regular rhythm.     Heart sounds: No murmur heard.   Pulmonary:     Effort: Pulmonary effort is normal.     Breath sounds: Normal breath sounds.  Neurological:     Mental Status: She is alert.  BP 120/80   Pulse (!) 52   Ht 5\' 2"  (1.575 m)   Wt 176 lb (79.8 kg)   SpO2 99%   BMI 32.19 kg/m  Wt Readings from Last 3 Encounters:  12/30/20 176 lb (79.8 kg)  03/15/20 162 lb 9.6 oz (73.8 kg)  02/09/20 160 lb 14.4 oz (73 kg)     Health Maintenance Due  Topic Date Due  . Hepatitis C Screening  Never done  . COVID-19 Vaccine (1) Never done  . HIV Screening  Never done  . TETANUS/TDAP  Never done  . PAP SMEAR-Modifier  06/09/2018  . COLONOSCOPY (Pts 45-10yrs Insurance coverage will need to be confirmed)  Never done  . INFLUENZA VACCINE  06/30/2020    There are no preventive care reminders to display for this patient.  Lab Results  Component Value Date   TSH 1.76 06/11/2015   Lab Results   Component Value Date   WBC 8.3 02/08/2020   HGB 12.7 02/08/2020   HCT 39.1 02/08/2020   MCV 92.7 02/08/2020   PLT 285 02/08/2020   Lab Results  Component Value Date   NA 139 02/08/2020   K 3.5 02/08/2020   CO2 21 (L) 02/08/2020   GLUCOSE 86 02/08/2020   BUN 7 02/08/2020   CREATININE 0.85 02/08/2020   BILITOT 0.7 02/08/2020   ALKPHOS 40 02/08/2020   AST 31 02/08/2020   ALT 30 02/08/2020   PROT 6.8 02/08/2020   ALBUMIN 3.9 02/08/2020   CALCIUM 8.6 (L) 02/08/2020   ANIONGAP 13 02/08/2020   GFR 98.25 06/11/2015   Lab Results  Component Value Date   CHOL 156 06/11/2015   Lab Results  Component Value Date   HDL 63.00 06/11/2015   Lab Results  Component Value Date   LDLCALC 83 06/11/2015   Lab Results  Component Value Date   TRIG 50.0 06/11/2015   Lab Results  Component Value Date   CHOLHDL 2 06/11/2015   No results found for: HGBA1C    Assessment & Plan:   Problem List Items Addressed This Visit   None   Visit Diagnoses    Colon cancer screening    -  Primary   Relevant Orders   Ambulatory referral to Gastroenterology    Patient requesting referral for colonoscopy screening.  Asymptomatic.  Biologic father's family history unknown.  No known family history of colon cancer. -We have asked that patient confirm insurance coverage for this. -We will go ahead with referral to GI for colon cancer screening  No orders of the defined types were placed in this encounter.   Follow-up: No follow-ups on file.    08/12/2015, MD

## 2021-01-17 ENCOUNTER — Encounter: Payer: Self-pay | Admitting: Internal Medicine

## 2021-04-04 ENCOUNTER — Encounter: Payer: No Typology Code available for payment source | Admitting: Internal Medicine

## 2021-05-02 ENCOUNTER — Other Ambulatory Visit: Payer: Self-pay

## 2021-05-07 ENCOUNTER — Encounter: Payer: Self-pay | Admitting: Internal Medicine

## 2021-06-11 ENCOUNTER — Encounter: Payer: No Typology Code available for payment source | Admitting: Internal Medicine

## 2021-07-16 ENCOUNTER — Ambulatory Visit (AMBULATORY_SURGERY_CENTER): Payer: Self-pay

## 2021-07-16 ENCOUNTER — Other Ambulatory Visit: Payer: Self-pay

## 2021-07-16 VITALS — Ht 62.0 in | Wt 161.0 lb

## 2021-07-16 DIAGNOSIS — Z1211 Encounter for screening for malignant neoplasm of colon: Secondary | ICD-10-CM

## 2021-07-16 MED ORDER — SUTAB 1479-225-188 MG PO TABS: 12.0000 | ORAL_TABLET | ORAL | 0 refills | Status: DC

## 2021-07-16 NOTE — Progress Notes (Signed)
No allergies to soy or egg Pt is not on blood thinners or diet pills Denies issues with sedation/intubation--has never been sedated/intubated Denies atrial flutter/fib Denies constipation   Emmi instructions given to pt  Pt is aware of Covid safety and care partner requirements.   Discussed taking sutabs--pt felt she could get the pills down.

## 2021-07-25 ENCOUNTER — Telehealth: Payer: Self-pay | Admitting: Internal Medicine

## 2021-07-25 NOTE — Telephone Encounter (Signed)
Hi Dr. Rhea Belton, this patient just called to cancel procedure that was scheduled on 08/19/21 because she had a death in her family. Patient has rescheduled to 08/11/21. Thank you.

## 2021-07-30 ENCOUNTER — Encounter: Payer: No Typology Code available for payment source | Admitting: Internal Medicine

## 2021-08-05 ENCOUNTER — Encounter: Payer: Self-pay | Admitting: Internal Medicine

## 2021-08-11 ENCOUNTER — Encounter: Payer: No Typology Code available for payment source | Admitting: Internal Medicine

## 2021-08-15 ENCOUNTER — Other Ambulatory Visit: Payer: Self-pay

## 2021-08-15 ENCOUNTER — Encounter: Payer: Self-pay | Admitting: Internal Medicine

## 2021-08-15 ENCOUNTER — Ambulatory Visit (AMBULATORY_SURGERY_CENTER): Payer: No Typology Code available for payment source | Admitting: Internal Medicine

## 2021-08-15 VITALS — BP 104/71 | HR 55 | Temp 98.6°F | Resp 16 | Ht 62.0 in | Wt 161.0 lb

## 2021-08-15 DIAGNOSIS — Z1211 Encounter for screening for malignant neoplasm of colon: Secondary | ICD-10-CM

## 2021-08-15 MED ORDER — SODIUM CHLORIDE 0.9 % IV SOLN: 500.0000 mL | Freq: Once | INTRAVENOUS | Status: DC

## 2021-08-15 NOTE — Patient Instructions (Signed)
Repeat colonoscopy in 10 years for screening purposes.  ° °YOU HAD AN ENDOSCOPIC PROCEDURE TODAY AT THE [facility]:   Refer to the procedure report that was given to you for any specific questions about what was found during the examination.  If the procedure report does not answer your questions, please call your gastroenterologist to clarify.  If you requested that your care partner not be given the details of your procedure findings, then the procedure report has been included in a sealed envelope for you to review at your convenience later. ° °YOU SHOULD EXPECT: Some feelings of bloating in the abdomen. Passage of more gas than usual.  Walking can help get rid of the air that was put into your GI tract during the procedure and reduce the bloating. If you had a lower endoscopy (such as a colonoscopy or flexible sigmoidoscopy) you may notice spotting of blood in your stool or on the toilet paper. If you underwent a bowel prep for your procedure, you may not have a normal bowel movement for a few days. ° °Please Note:  You might notice some irritation and congestion in your nose or some drainage.  This is from the oxygen used during your procedure.  There is no need for concern and it should clear up in a day or so. ° °SYMPTOMS TO REPORT IMMEDIATELY: ° °Following lower endoscopy (colonoscopy or flexible sigmoidoscopy): ° Excessive amounts of blood in the stool ° Significant tenderness or worsening of abdominal pains ° Swelling of the abdomen that is new, acute ° Fever of 100°F or higher ° °For urgent or emergent issues, a gastroenterologist can be reached at any hour by calling [phone]. °Do not use MyChart messaging for urgent concerns.  ° ° °DIET:  We do recommend a small meal at first, but then you may proceed to your regular diet.  Drink plenty of fluids but you should avoid alcoholic beverages for 24 hours. ° °ACTIVITY:  You should plan to take it easy for the rest of today and you should  NOT DRIVE or use heavy machinery until tomorrow (because of the sedation medicines used during the test).   ° °FOLLOW UP: °Our staff will call the number listed on your records 48-72 hours following your procedure to check on you and address any questions or concerns that you may have regarding the information given to you following your procedure. If we do not reach you, we will leave a message.  We will attempt to reach you two times.  During this call, we will ask if you have developed any symptoms of COVID 19. If you develop any symptoms (ie: fever, flu-like symptoms, shortness of breath, cough etc.) before then, please call [phone].  If you test positive for Covid 19 in the 2 weeks post procedure, please call and report this information to us.   ° °If any biopsies were taken you will be contacted by phone or by letter within the next 1-3 weeks.  Please call us at [phone] if you have not heard about the biopsies in 3 weeks.  ° ° °SIGNATURES/CONFIDENTIALITY: °You and/or your care partner have signed paperwork which will be entered into your electronic medical record.  These signatures attest to the fact that that the information above on your After Visit Summary has been reviewed and is understood.  Full responsibility of the confidentiality of this discharge information lies with you and/or your care-partner. ° °

## 2021-08-15 NOTE — Op Note (Signed)
[facility] Patient Name: Rachel Walker Procedure Date: 08/15/2021 11:37 AM MRN: [id number] Endoscopist: Beverley Fiedler , MD Age: 45 Referring MD:  Date of Birth: Aug 12, 1976 Gender: Female Account #: [id number] Procedure:                Colonoscopy Indications:              Screening for colorectal malignant neoplasm, This                            is the patient's first colonoscopy Medicines:                Monitored Anesthesia Care Procedure:                Pre-Anesthesia Assessment:                           - Prior to the procedure, a History and Physical                            was performed, and patient medications and                            allergies were reviewed. The patient's tolerance of                            previous anesthesia was also reviewed. The risks                            and benefits of the procedure and the sedation                            options and risks were discussed with the patient.                            All questions were answered, and informed consent                            was obtained. Prior Anticoagulants: The patient has                            taken no previous anticoagulant or antiplatelet                            agents. ASA Grade Assessment: II - A patient with                            mild systemic disease. After reviewing the risks                            and benefits, the patient was deemed in                            satisfactory condition to undergo the procedure.  After obtaining informed consent, the colonoscope                            was passed under direct vision. Throughout the                            procedure, the patient's blood pressure, pulse, and                            oxygen saturations were monitored continuously. The                            Olympus PCF-H190DL (#[id number]) Colonoscope was                            introduced through the anus  and advanced to the                            cecum, identified by appendiceal orifice and                            ileocecal valve. The colonoscopy was performed                            without difficulty. The patient tolerated the                            procedure well. The quality of the bowel                            preparation was good. The ileocecal valve,                            appendiceal orifice, and rectum were photographed. Scope In: 11:43:37 AM Scope Out: 11:58:38 AM Scope Withdrawal Time: 0 hours 11 minutes 26 seconds  Total Procedure Duration: 0 hours 15 minutes 1 second  Findings:                 The digital rectal exam was normal.                           The entire examined colon appeared normal on direct                            and retroflexion views. Complications:            No immediate complications. Estimated Blood Loss:     Estimated blood loss: none. Impression:               - The entire examined colon is normal on direct and                            retroflexion views.                           - No specimens collected. Recommendation:           -  Patient has a contact number available for                            emergencies. The signs and symptoms of potential                            delayed complications were discussed with the                            patient. Return to normal activities tomorrow.                            Written discharge instructions were provided to the                            patient.                           - Resume previous diet.                           - Continue present medications.                           - Repeat colonoscopy in 10 years for screening                            purposes. Beverley Fiedler, MD 08/15/2021 12:00:34 PM This report has been signed electronically.

## 2021-08-15 NOTE — Progress Notes (Signed)
Vitals not coming over  Report to PACU, RN, vss, BBS= Clear.

## 2021-08-15 NOTE — Progress Notes (Signed)
Vital signs checked by:DT  The patient states no changes in medical or surgical history since pre-visit screening on 07/16/21.

## 2021-08-15 NOTE — Progress Notes (Signed)
GASTROENTEROLOGY PROCEDURE H&P NOTE   Primary Care Physician: Kristian Covey, MD    Reason for Procedure:  Colon cancer screening  Plan:    Colonoscopy  Patient is appropriate for endoscopic procedure(s) in the ambulatory ([facility]) setting.  The nature of the procedure, as well as the risks, benefits, and alternatives were carefully and thoroughly reviewed with the patient. Ample time for discussion and questions allowed. The patient understood, was satisfied, and agreed to proceed.     HPI: Rachel Walker is a 45 y.o. female who presents for screening colonoscopy.  No prior colonoscopy.  No complaints today including recent chest pain, shortness of breath and abdominal pain.  Tolerated  Past Medical History:  Diagnosis Date   Anemia    UTI (urinary tract infection)     History reviewed. No pertinent surgical history.  Prior to Admission medications   Medication Sig Start Date End Date Taking? Authorizing Provider  acetaminophen (TYLENOL) 325 MG tablet Take 650 mg by mouth every 6 (six) hours as needed for mild pain, fever or headache.   Yes [provider]  Ginger, Zingiber officinalis, (GINGER PO) Take 1 tablet by mouth daily.    [provider]  Turmeric (QC TUMERIC COMPLEX PO) Take 1 tablet by mouth daily. Patient not taking: Reported on 08/15/2021    [provider]    Current Outpatient Medications  Medication Sig Dispense Refill   acetaminophen (TYLENOL) 325 MG tablet Take 650 mg by mouth every 6 (six) hours as needed for mild pain, fever or headache.     Ginger, Zingiber officinalis, (GINGER PO) Take 1 tablet by mouth daily.     Turmeric (QC TUMERIC COMPLEX PO) Take 1 tablet by mouth daily. (Patient not taking: Reported on 08/15/2021)     Current Facility-Administered Medications  Medication Dose Route Frequency Provider Last Rate Last Admin   0.9 %  sodium chloride infusion  500 mL Intravenous Once Ezechiel Stooksbury, Carie Caddy, MD         Allergies as of 08/15/2021   (No Known Allergies)    Family History  Problem Relation Age of Onset   Dementia Maternal Grandfather    Colon cancer Neg Hx    Colon polyps Neg Hx    Esophageal cancer Neg Hx    Rectal cancer Neg Hx    Stomach cancer Neg Hx     Social History   Socioeconomic History   Marital status: Married    Spouse name: Not on file   Number of children: Not on file   Years of education: Not on file   Highest education level: Not on file  Occupational History   Not on file  Tobacco Use   Smoking status: Former    Packs/day: 0.50    Years: 5.00    Pack years: 2.50    Types: Cigarettes    Quit date: 02/20/2000    Years since quitting: 21.4   Smokeless tobacco: Never  Vaping Use   Vaping Use: Never used  Substance and Sexual Activity   Alcohol use: Yes    Comment: on weekends   Drug use: No   Sexual activity: Not on file  Other Topics Concern   Not on file  Social History Narrative   Not on file   Social Determinants of Health   Financial Resource Strain: Not on file  Food Insecurity: Not on file  Transportation Needs: Not on file  Physical Activity: Not on file  Stress: Not on file  Social Connections: Not on file  Intimate Partner Violence: Not on file    Physical Exam: Vital signs in last 24 hours: @BP  105/69   Pulse 65   Temp 98.6 F (37 C)   Ht 5\' 2"  (1.575 m)   Wt 161 lb (73 kg)   LMP 07/31/2021 (Approximate)   SpO2 100%   BMI 29.45 kg/m  GEN: NAD EYE: Sclerae anicteric ENT: MMM CV: Non-tachycardic Pulm: CTA b/l GI: Soft, NT/ND NEURO:  Alert & Oriented x 3   , MD [facility]  08/15/2021 11:34 AM

## 2021-08-19 ENCOUNTER — Telehealth: Payer: Self-pay

## 2021-08-19 NOTE — Telephone Encounter (Signed)
  Follow up Call-  Call back number 08/15/2021  Post procedure Call Back phone  # [phone]  Permission to leave phone message Yes  Some recent data might be hidden     Patient questions:  Do you have a fever, pain , or abdominal swelling? No. Pain Score  0 *  Have you tolerated food without any problems? Yes.    Have you been able to return to your normal activities? Yes.    Do you have any questions about your discharge instructions: Diet   No. Medications  No. Follow up visit  No.  Do you have questions or concerns about your Care? No.  Actions: * If pain score is 4 or above: No action needed, pain <4.

## 2021-11-03 ENCOUNTER — Other Ambulatory Visit: Payer: Self-pay | Admitting: Obstetrics and Gynecology

## 2021-11-03 DIAGNOSIS — R928 Other abnormal and inconclusive findings on diagnostic imaging of breast: Secondary | ICD-10-CM

## 2021-11-06 ENCOUNTER — Ambulatory Visit
Admission: RE | Admit: 2021-11-06 | Discharge: 2021-11-06 | Disposition: A | Discharge status: Home / Self Care | Payer: No Typology Code available for payment source | Source: Ambulatory Visit | Attending: Obstetrics and Gynecology | Admitting: Obstetrics and Gynecology

## 2021-11-06 ENCOUNTER — Ambulatory Visit: Payer: No Typology Code available for payment source

## 2021-11-06 ENCOUNTER — Other Ambulatory Visit: Payer: Self-pay

## 2021-11-06 DIAGNOSIS — R928 Other abnormal and inconclusive findings on diagnostic imaging of breast: Secondary | ICD-10-CM

## 2022-04-14 IMAGING — MG MM DIGITAL DIAGNOSTIC UNILAT*L* W/ TOMO W/ CAD
4 series · 4 of 12 positions shown · non-contrast
Comparison: Previous exam(s).

CLINICAL DATA: 45-year-old female recalled from screening mammogram
dated 10/30/2021 for a possible left breast asymmetry.

EXAM:
DIGITAL DIAGNOSTIC UNILATERAL LEFT MAMMOGRAM WITH TOMOSYNTHESIS AND
CAD
TECHNIQUE: Left digital diagnostic mammography and breast tomosynthesis was
performed. The images were evaluated with computer-aided detection.

[L ML synth-2D]
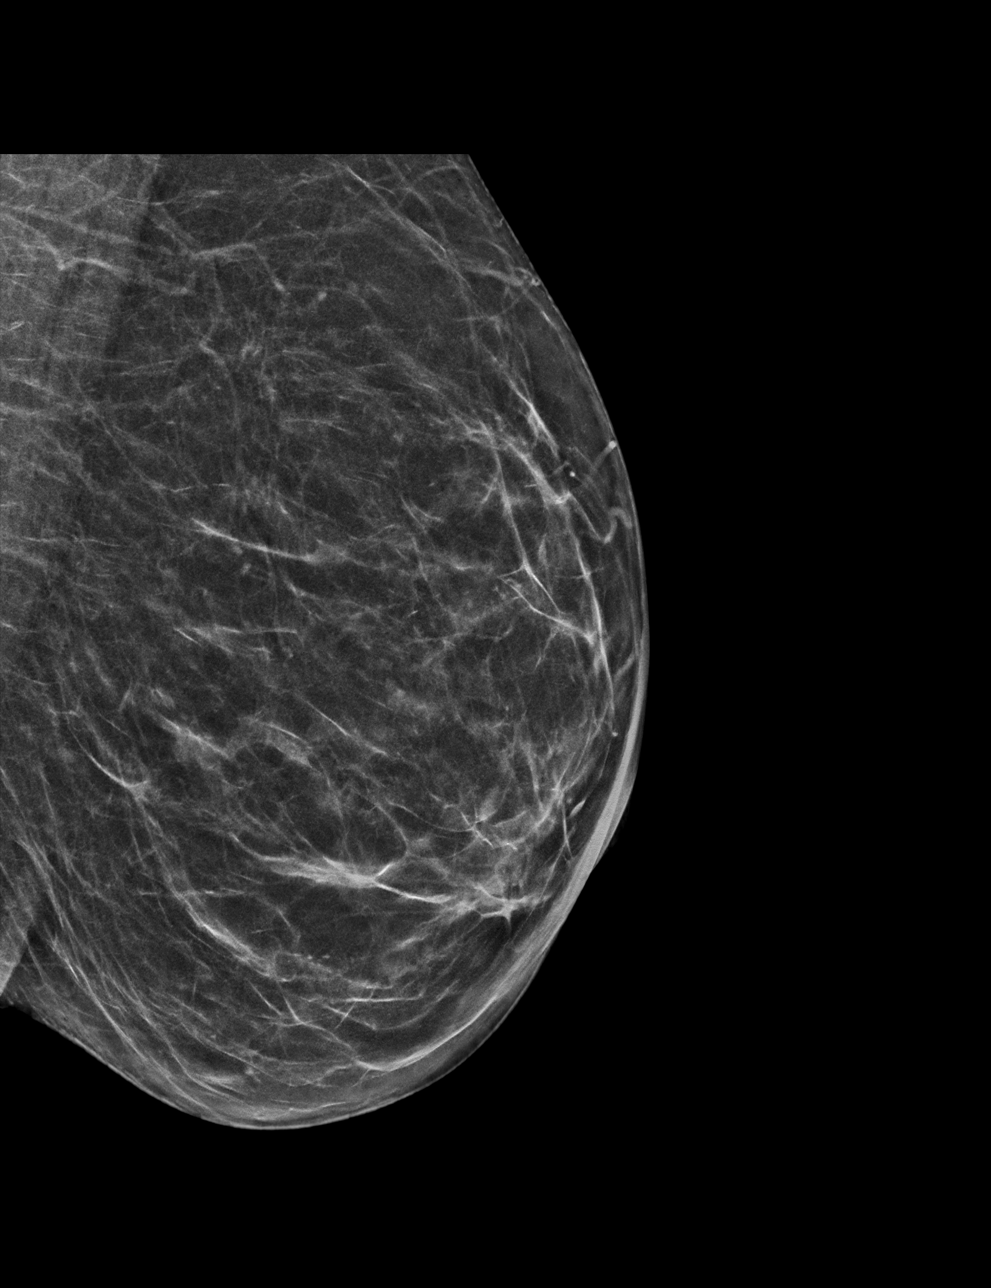

[L MLO synth-2D]
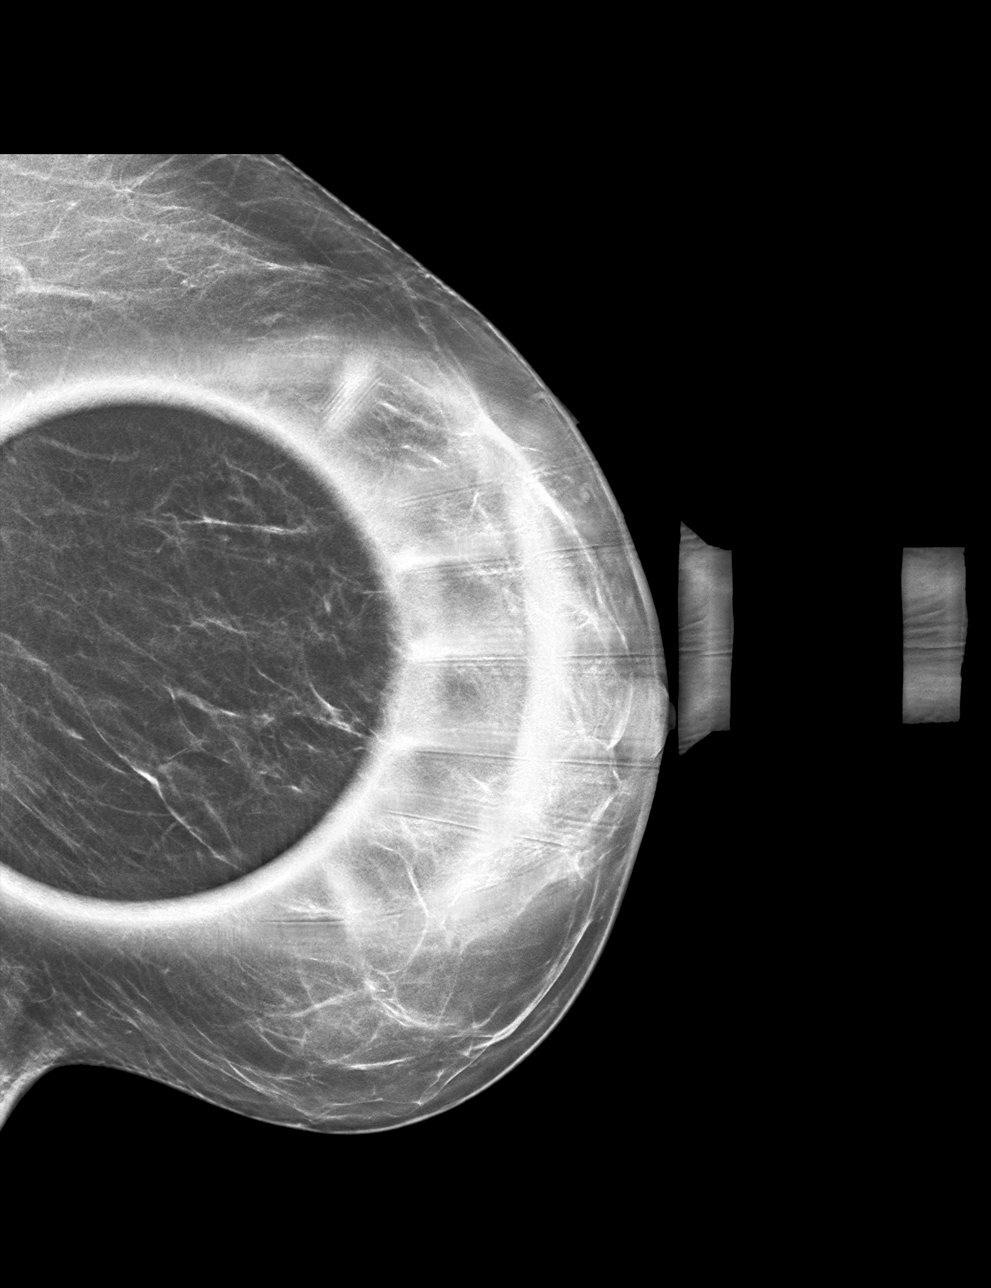

[L ML tomo · tomo slice 26/51.0]
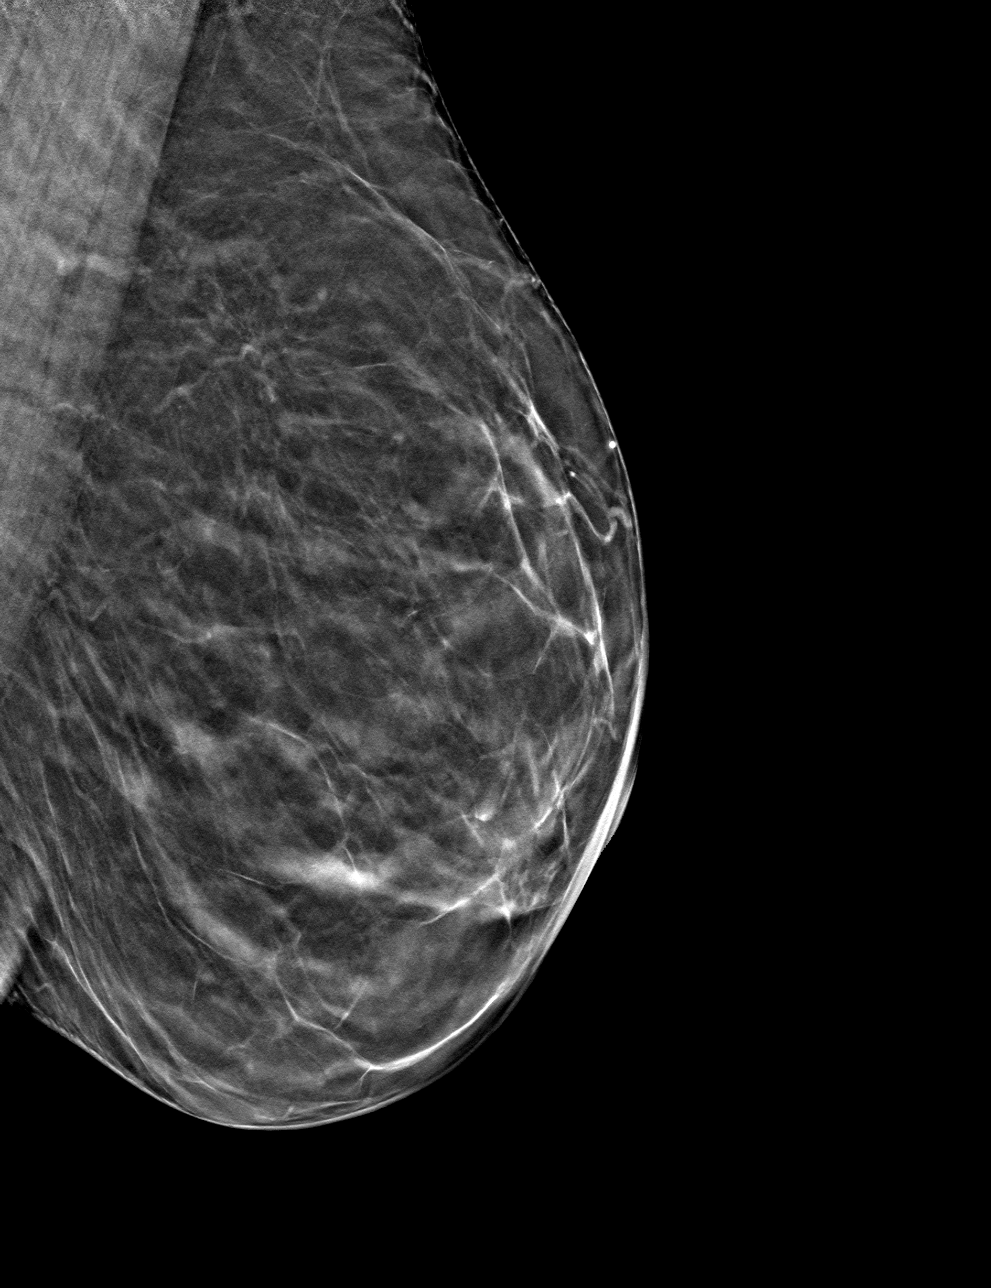

[L MLO tomo · tomo slice 24/47.0]
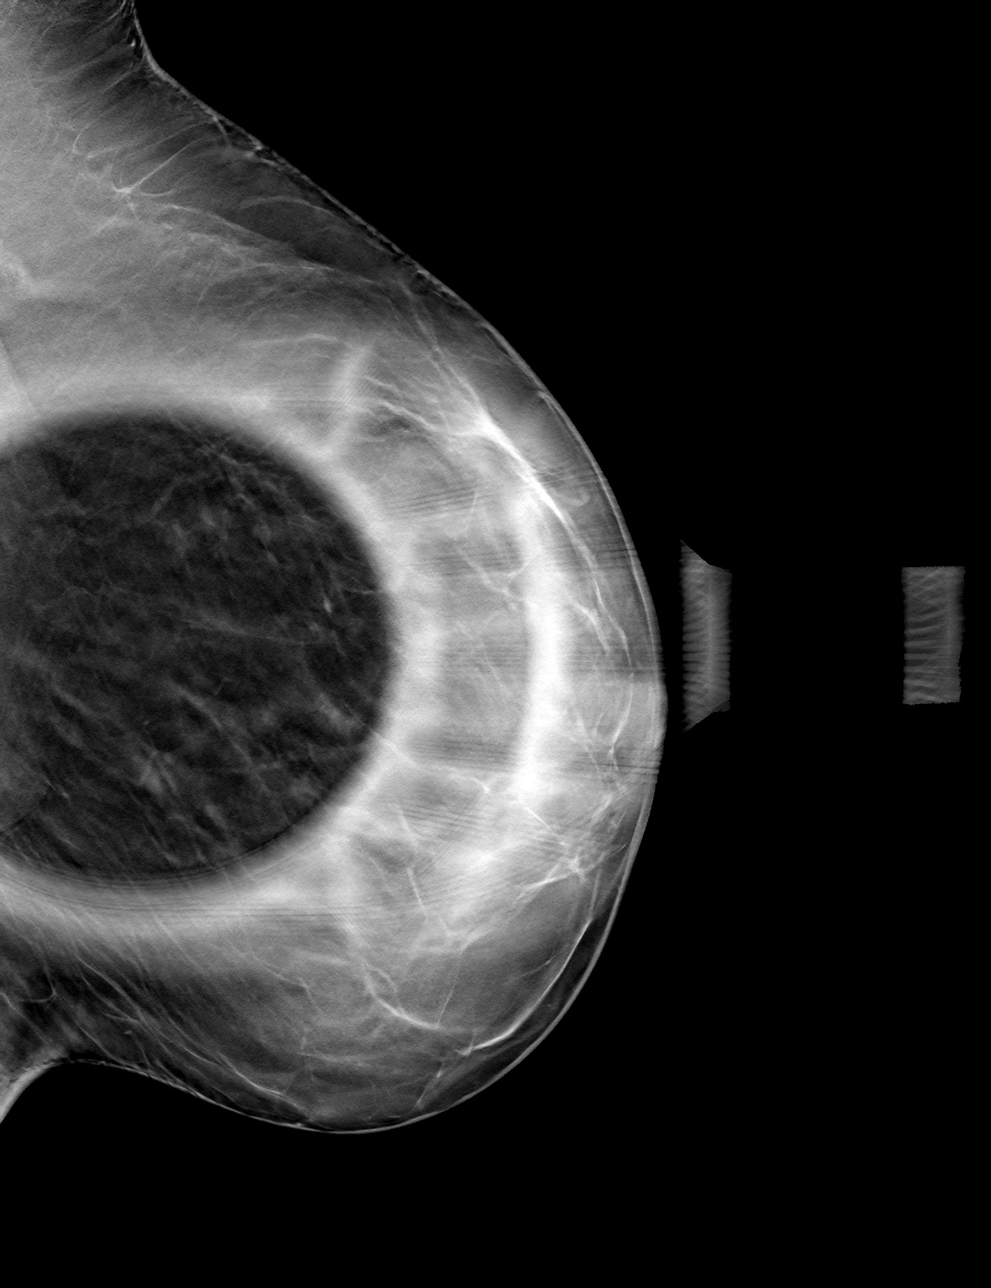

[4 of 12 positions shown; findings below may reference images not displayed]

ACR Breast Density Category b: There are scattered areas of
fibroglandular density.
FINDINGS: Possible asymmetry in the central left breast is seen on the cc
projection resolves into well dispersed fibroglandular tissue on
today's additional views. No suspicious findings identified.
IMPRESSION: No mammographic evidence of malignancy.

RECOMMENDATION:
Screening mammogram in one year.(Code:LO-[id number])

I have discussed the findings and recommendations with the patient.
If applicable, a reminder letter will be sent to the patient
regarding the next appointment.

BI-RADS CATEGORY  1: Negative.
